# Patient Record
Sex: Male | Born: 1965 | Race: White | Hispanic: No | Smoking: Current every day smoker
Health system: Southern US, Community
[De-identification: ages and names within clinical notes are randomized; demographics above are authoritative.]

## PROBLEM LIST (undated history)

## (undated) DIAGNOSIS — I499 Cardiac arrhythmia, unspecified: Secondary | ICD-10-CM

## (undated) DIAGNOSIS — K8020 Calculus of gallbladder without cholecystitis without obstruction: Secondary | ICD-10-CM

---

## 2003-08-09 ENCOUNTER — Emergency Department (HOSPITAL_COMMUNITY): Admission: EM | Admit: 2003-08-09 | Discharge: 2003-08-09 | Payer: Self-pay | Admitting: Emergency Medicine

## 2005-09-08 ENCOUNTER — Emergency Department: Payer: Self-pay | Admitting: Emergency Medicine

## 2010-02-12 ENCOUNTER — Emergency Department (HOSPITAL_COMMUNITY): Admission: EM | Admit: 2010-02-12 | Discharge: 2010-02-12 | Payer: Self-pay | Admitting: Emergency Medicine

## 2010-02-26 ENCOUNTER — Emergency Department: Payer: Self-pay | Admitting: Emergency Medicine

## 2010-03-04 ENCOUNTER — Emergency Department (HOSPITAL_COMMUNITY): Admission: EM | Admit: 2010-03-04 | Discharge: 2010-03-04 | Payer: Self-pay | Admitting: Emergency Medicine

## 2011-07-18 HISTORY — PX: WISDOM TOOTH EXTRACTION: SHX21

## 2014-06-28 ENCOUNTER — Encounter (HOSPITAL_COMMUNITY): Payer: Self-pay | Admitting: Emergency Medicine

## 2014-06-28 ENCOUNTER — Emergency Department (HOSPITAL_COMMUNITY): Payer: BC Managed Care – PPO

## 2014-06-28 ENCOUNTER — Inpatient Hospital Stay (HOSPITAL_COMMUNITY)
Admission: EM | Admit: 2014-06-28 | Discharge: 2014-06-29 | DRG: 440 | Disposition: A | Discharge status: Home / Self Care | Payer: BC Managed Care – PPO | Attending: Family Medicine | Admitting: Family Medicine

## 2014-06-28 DIAGNOSIS — K859 Acute pancreatitis without necrosis or infection, unspecified: Secondary | ICD-10-CM

## 2014-06-28 DIAGNOSIS — K8020 Calculus of gallbladder without cholecystitis without obstruction: Secondary | ICD-10-CM | POA: Diagnosis present

## 2014-06-28 DIAGNOSIS — R1011 Right upper quadrant pain: Secondary | ICD-10-CM

## 2014-06-28 DIAGNOSIS — F17210 Nicotine dependence, cigarettes, uncomplicated: Secondary | ICD-10-CM | POA: Diagnosis present

## 2014-06-28 HISTORY — DX: Calculus of gallbladder without cholecystitis without obstruction: K80.20

## 2014-06-28 LAB — CBC WITH DIFFERENTIAL/PLATELET
BASOS PCT: 1 % (ref 0–1)
Basophils Absolute: 0.1 10*3/uL (ref 0.0–0.1)
Eosinophils Absolute: 0.2 10*3/uL (ref 0.0–0.7)
Eosinophils Relative: 2 % (ref 0–5)
HCT: 46.6 % (ref 39.0–52.0)
HEMOGLOBIN: 16 g/dL (ref 13.0–17.0)
LYMPHS ABS: 2.9 10*3/uL (ref 0.7–4.0)
Lymphocytes Relative: 26 % (ref 12–46)
MCH: 31.8 pg (ref 26.0–34.0)
MCHC: 34.3 g/dL (ref 30.0–36.0)
MCV: 92.6 fL (ref 78.0–100.0)
MONO ABS: 0.5 10*3/uL (ref 0.1–1.0)
Monocytes Relative: 4 % (ref 3–12)
Neutro Abs: 7.5 10*3/uL (ref 1.7–7.7)
Neutrophils Relative %: 67 % (ref 43–77)
Platelets: 348 10*3/uL (ref 150–400)
RBC: 5.03 MIL/uL (ref 4.22–5.81)
RDW: 13.1 % (ref 11.5–15.5)
WBC: 11 10*3/uL — ABNORMAL HIGH (ref 4.0–10.5)

## 2014-06-28 LAB — COMPREHENSIVE METABOLIC PANEL
ALBUMIN: 4.1 g/dL (ref 3.5–5.2)
ALT: 20 U/L (ref 0–53)
ANION GAP: 13 (ref 5–15)
AST: 16 U/L (ref 0–37)
Alkaline Phosphatase: 92 U/L (ref 39–117)
BILIRUBIN TOTAL: 0.2 mg/dL — AB (ref 0.3–1.2)
BUN: 17 mg/dL (ref 6–23)
CHLORIDE: 99 meq/L (ref 96–112)
CO2: 26 mEq/L (ref 19–32)
CREATININE: 0.93 mg/dL (ref 0.50–1.35)
Calcium: 9.1 mg/dL (ref 8.4–10.5)
GFR calc Af Amer: >90 mL/min (ref >90)
GLUCOSE: 102 mg/dL — AB (ref 70–99)
Potassium: 3.7 mEq/L (ref 3.7–5.3)
Sodium: 138 mEq/L (ref 137–147)
Total Protein: 7.4 g/dL (ref 6.0–8.3)

## 2014-06-28 LAB — LIPASE, BLOOD: Lipase: 465 U/L — ABNORMAL HIGH (ref 11–59)

## 2014-06-28 MED ORDER — SODIUM CHLORIDE 0.9 % IV SOLN
INTRAVENOUS | Status: DC
  Administered 2014-06-28 – 2014-06-29 (×3): via INTRAVENOUS

## 2014-06-28 MED ORDER — HYDROMORPHONE HCL 1 MG/ML IJ SOLN
1.0000 mg | Freq: Once | INTRAMUSCULAR | Status: AC
  Administered 2014-06-28: 1 mg via INTRAVENOUS
  Filled 2014-06-28: qty 1

## 2014-06-28 MED ORDER — MORPHINE SULFATE 4 MG/ML IJ SOLN
6.0000 mg | Freq: Once | INTRAMUSCULAR | Status: AC
  Administered 2014-06-28: 6 mg via INTRAVENOUS
  Filled 2014-06-28: qty 2

## 2014-06-28 MED ORDER — DOCUSATE SODIUM 100 MG PO CAPS: 100.0000 mg | ORAL_CAPSULE | Freq: Every day | ORAL | Status: DC | PRN

## 2014-06-28 MED ORDER — ALUM & MAG HYDROXIDE-SIMETH 200-200-20 MG/5ML PO SUSP: 30.0000 mL | Freq: Four times a day (QID) | ORAL | Status: DC | PRN

## 2014-06-28 MED ORDER — ACETAMINOPHEN 325 MG PO TABS: 650.0000 mg | ORAL_TABLET | Freq: Four times a day (QID) | ORAL | Status: DC | PRN

## 2014-06-28 MED ORDER — ONDANSETRON HCL 4 MG/2ML IJ SOLN
4.0000 mg | Freq: Three times a day (TID) | INTRAMUSCULAR | Status: AC | PRN
  Administered 2014-06-29: 4 mg via INTRAVENOUS
  Filled 2014-06-28: qty 2

## 2014-06-28 MED ORDER — INFLUENZA VAC SPLIT QUAD 0.5 ML IM SUSY
0.5000 mL | PREFILLED_SYRINGE | INTRAMUSCULAR | Status: AC
  Administered 2014-06-29: 0.5 mL via INTRAMUSCULAR
  Filled 2014-06-28: qty 0.5

## 2014-06-28 MED ORDER — ENOXAPARIN SODIUM 40 MG/0.4ML ~~LOC~~ SOLN
40.0000 mg | SUBCUTANEOUS | Status: DC
  Administered 2014-06-28: 40 mg via SUBCUTANEOUS
  Filled 2014-06-28: qty 0.4

## 2014-06-28 MED ORDER — HYDROMORPHONE HCL 1 MG/ML IJ SOLN
1.0000 mg | INTRAMUSCULAR | Status: AC | PRN
  Filled 2014-06-28: qty 1

## 2014-06-28 MED ORDER — IOHEXOL 300 MG/ML  SOLN
100.0000 mL | Freq: Once | INTRAMUSCULAR | Status: AC | PRN
  Administered 2014-06-28: 100 mL via INTRAVENOUS

## 2014-06-28 MED ORDER — IOHEXOL 300 MG/ML  SOLN
50.0000 mL | Freq: Once | INTRAMUSCULAR | Status: AC | PRN
  Administered 2014-06-28: 50 mL via ORAL

## 2014-06-28 MED ORDER — HYDROMORPHONE HCL 1 MG/ML IJ SOLN
1.0000 mg | INTRAMUSCULAR | Status: DC | PRN
  Administered 2014-06-28 – 2014-06-29 (×4): 1 mg via INTRAVENOUS
  Filled 2014-06-28 (×3): qty 1

## 2014-06-28 MED ORDER — NICOTINE 14 MG/24HR TD PT24
14.0000 mg | MEDICATED_PATCH | Freq: Once | TRANSDERMAL | Status: DC
  Administered 2014-06-28: 14 mg via TRANSDERMAL
  Filled 2014-06-28: qty 1

## 2014-06-28 MED ORDER — PNEUMOCOCCAL VAC POLYVALENT 25 MCG/0.5ML IJ INJ
0.5000 mL | INJECTION | INTRAMUSCULAR | Status: AC
  Administered 2014-06-29: 0.5 mL via INTRAMUSCULAR
  Filled 2014-06-28: qty 0.5

## 2014-06-28 MED ORDER — ACETAMINOPHEN 650 MG RE SUPP: 650.0000 mg | Freq: Four times a day (QID) | RECTAL | Status: DC | PRN

## 2014-06-28 NOTE — Progress Notes (Signed)
Patient/Family oriented to room. Information packet given to patient/family. Admission inpatient armband information verified with patient/family to include name and date of birth and placed on patient arm. Side rails up x 2, fall assessment and education completed with patient/family. Call light within reach. Patient/family able to voice and demonstrate understanding of unit orientation instructions  Madiha Bambrick D, RN  

## 2014-06-28 NOTE — H&P (Signed)
History and Physical  Gabriel ClassJohnny L Erxleben WUJ:[id number] DOB: June 11, 1966 DOA: 06/28/2014  Referring physician: Dr. Patria Maneampos, ED physician PCP: No primary care provider on file.   Chief Complaint: Abdominal pain  HPI: Gabriel Bond is a 48 y.o. male  With a history of gallstones. Patient was seen in the emergency department today for right upper quadrant abdominal pain that started this morning. Patient rates the pain as severe, nonradiating. The pain improves briefly, then returns. Patient has had these episodes 3 or 4 times over the past year after discovering that he had gallstones. The pain is usually mild and constant and in the right upper quadrant. Patient does not eat during this time and the pain usually resolves within 24 hours. This time, the pain is more severe and continues to progress. Patient has not eaten anything since that pain began. He has been trying to maintain a healthy diet, although continues to eat some fatty foods. Patient denies alcohol use or any other medication use.  Review of Systems:   Pt complains of mild intermittent headaches.  Pt denies any fevers, chills, nausea, vomiting, earache, constipation, chest pain, shortness of breath, palpitations, cough, wheezing.  Review of systems are otherwise negative  Past Medical History  Diagnosis Date  . Gallstones    History reviewed. No pertinent past surgical history. Social History:  reports that he has been smoking Cigarettes.  He has a 45 pack-year smoking history. He has quit using smokeless tobacco. His smokeless tobacco use included Chew. He reports that he does not drink alcohol or use illicit drugs. Patient lives at home & is able to participate in activities of daily living without assistance  No Known Allergies  Family History  Problem Relation Age of Onset  . Cancer Other   . Diabetes Other       Prior to Admission medications   Medication Sig Start Date End Date Taking? Authorizing Provider    acetaminophen (TYLENOL) 500 MG tablet Take 500-1,500 mg by mouth every 6 (six) hours as needed.   Yes Historical Provider, MD    Physical Exam: BP 107/71 mmHg  Pulse 67  Temp(Src) 98.1 F (36.7 C) (Oral)  Resp 15  Ht 5\' 7"  (1.702 m)  Wt 81.647 kg (180 lb)  BMI 28.19 kg/m2  SpO2 95%  General: Middle-aged Caucasian male. Awake and alert and oriented x3. No acute cardiopulmonary distress.  Eyes: Pupils equal, round, reactive to light. Extraocular muscles are intact. Sclerae anicteric and noninjected.  ENT: Moist mucosal membranes. No mucosal lesions. Teeth in moderate repair  Neck: Neck supple without lymphadenopathy. No carotid bruits. No masses palpated.  Cardiovascular: Regular rate with normal S1-S2 sounds. No murmurs, rubs, gallops auscultated. No JVD.  Respiratory: Good respiratory effort with no wheezes, rales, rhonchi. Lungs clear to auscultation bilaterally.  Abdomen: Soft, nondistended. Right upper quadrant pain without rebound tenderness or guarding. Active bowel sounds. No masses or hepatosplenomegaly  Skin: Dry, warm to touch. 2+ dorsalis pedis and radial pulses. Musculoskeletal: No calf or leg pain. All major joints not erythematous nontender.  Psychiatric: Intact judgment and insight.  Neurologic: No focal neurological deficits. Cranial nerves II through XII are grossly intact.           Labs on Admission:  Basic Metabolic Panel:  Recent Labs Lab 06/28/14 1613  NA 138  K 3.7  CL 99  CO2 26  GLUCOSE 102*  BUN 17  CREATININE 0.93  CALCIUM 9.1   Liver Function Tests:  Recent Labs Lab  06/28/14 1613  AST 16  ALT 20  ALKPHOS 92  BILITOT 0.2*  PROT 7.4  ALBUMIN 4.1    Recent Labs Lab 06/28/14 1613  LIPASE 465*   No results for input(s): AMMONIA in the last 168 hours. CBC:  Recent Labs Lab 06/28/14 1613  WBC 11.0*  NEUTROABS 7.5  HGB 16.0  HCT 46.6  MCV 92.6  PLT 348   Cardiac Enzymes: No results for input(s): CKTOTAL, CKMB, CKMBINDEX,  TROPONINI in the last 168 hours.  BNP (last 3 results) No results for input(s): PROBNP in the last 8760 hours. CBG: No results for input(s): GLUCAP in the last 168 hours.  Radiological Exams on Admission: Ct Abdomen Pelvis W Contrast  06/28/2014   CLINICAL DATA:  Initial evaluation right upper quadrant pain began this morning with nausea, personal history of gallbladder disease  EXAM: CT ABDOMEN AND PELVIS WITH CONTRAST  TECHNIQUE: Multidetector CT imaging of the abdomen and pelvis was performed using the standard protocol following bolus administration of intravenous contrast.  CONTRAST:  50mL OMNIPAQUE IOHEXOL 300 MG/ML SOLN, 100mL OMNIPAQUE IOHEXOL 300 MG/ML SOLN  COMPARISON:  None.  FINDINGS: Mild diffuse hepatic steatosis. Gallbladder appears normal. Spleen is normal. Pancreas is normal. Stomach is normal.  Adrenal glands are normal. Kidneys are normal.Large bowel is normal except for mild diverticulosis. There are several loops of small bowel in the left upper quadrant that show mild wall thickening.  Appendix is normal. Bladder is normal. Reproductive organs are normal. No acute musculoskeletal findings.  IMPRESSION: Findings suggest mild proximal small bowel enteritis.   Electronically Signed   By: Esperanza Heiraymond  Rubner M.D.   On: 06/28/2014 17:55     Assessment/Plan Present on Admission:  . Pancreatitis  #1 pancreatitis Possible gallstone pancreatitis. Will admit the patient and continue nothing by mouth. IV fluids at 200 mL per hour. Continue pain control and repeat lipase in the morning. Will obtain ultrasound to evaluate for gallstones, particularly gallstone in common bile duct.   DVT prophylaxis: Lovenox  Consultants: None  Code Status: Full code  Family Communication: None   Disposition Plan: Home following resolution  Time spent: 50 minutes  Candelaria CelesteJacob Horris Speros, DO [facility] Pager [phone]

## 2014-06-28 NOTE — ED Notes (Signed)
C/o nausea this am and on ride to ER.  Denies vomiting and diarrhea.  Abdomen firm.  No c/o chest pain or SOB.

## 2014-06-28 NOTE — ED Notes (Signed)
Report given to shaquanda RN

## 2014-06-28 NOTE — ED Notes (Signed)
Oral contrast completed.

## 2014-06-28 NOTE — ED Provider Notes (Signed)
CSN: [id number]     Arrival date & time 06/28/14  1518 History   First MD Initiated Contact with Patient 06/28/14 1548     Chief Complaint  Patient presents with  . Abdominal Pain      HPI Patient ports history gallstones reports he developed upper and right upper quadrant abdominal pain which began this morning.  It seems to wax and wane.  He reports nausea without any vomiting.  Denies diarrhea.  Upon my arrival to the room the patient began vomiting.  No hematemesis.  Patient denies alcohol abuse.  Patient is been referred to general surgery better at that time elected not to have his gallbladder removed.  Denies fevers or chills.  Pain is moderate in severity.   Past Medical History  Diagnosis Date  . Gallstones    History reviewed. No pertinent past surgical history. Family History  Problem Relation Age of Onset  . Cancer Other   . Diabetes Other    History  Substance Use Topics  . Smoking status: Current Every Day Smoker -- 1.50 packs/day for 30 years    Types: Cigarettes  . Smokeless tobacco: Former NeurosurgeonUser    Types: Chew  . Alcohol Use: No    Review of Systems  All other systems reviewed and are negative.     Allergies  Review of patient's allergies indicates no known allergies.  Home Medications   Prior to Admission medications   Medication Sig Start Date End Date Taking? Authorizing Provider  acetaminophen (TYLENOL) 500 MG tablet Take 500-1,500 mg by mouth every 6 (six) hours as needed.   Yes Historical Provider, MD   BP 104/75 mmHg  Pulse 56  Temp(Src) 98.1 F (36.7 C) (Oral)  Resp 18  Ht 5\' 7"  (1.702 m)  Wt 180 lb (81.647 kg)  BMI 28.19 kg/m2  SpO2 99% Physical Exam  Constitutional: He is oriented to person, place, and time. He appears well-developed and well-nourished.  HENT:  Head: Normocephalic and atraumatic.  Eyes: EOM are normal.  Neck: Normal range of motion.  Cardiovascular: Normal rate, regular rhythm, normal heart sounds and intact  distal pulses.   Pulmonary/Chest: Effort normal and breath sounds normal. No respiratory distress.  Abdominal: Soft. He exhibits no distension.  Epigastric and right upper quadrant tenderness without guarding or rebound  Musculoskeletal: Normal range of motion.  Neurological: He is alert and oriented to person, place, and time.  Skin: Skin is warm and dry.  Psychiatric: He has a normal mood and affect. Judgment normal.  Nursing note and vitals reviewed.   ED Course  Procedures (including critical care time) Labs Review Labs Reviewed  CBC WITH DIFFERENTIAL - Abnormal; Notable for the following:    WBC 11.0 (*)    All other components within normal limits  COMPREHENSIVE METABOLIC PANEL - Abnormal; Notable for the following:    Glucose, Bld 102 (*)    Total Bilirubin 0.2 (*)    All other components within normal limits  LIPASE, BLOOD - Abnormal; Notable for the following:    Lipase 465 (*)    All other components within normal limits    Imaging Review Ct Abdomen Pelvis W Contrast  06/28/2014   CLINICAL DATA:  Initial evaluation right upper quadrant pain began this morning with nausea, personal history of gallbladder disease  EXAM: CT ABDOMEN AND PELVIS WITH CONTRAST  TECHNIQUE: Multidetector CT imaging of the abdomen and pelvis was performed using the standard protocol following bolus administration of intravenous contrast.  CONTRAST:  50mL OMNIPAQUE IOHEXOL 300 MG/ML SOLN, 100mL OMNIPAQUE IOHEXOL 300 MG/ML SOLN  COMPARISON:  None.  FINDINGS: Mild diffuse hepatic steatosis. Gallbladder appears normal. Spleen is normal. Pancreas is normal. Stomach is normal.  Adrenal glands are normal. Kidneys are normal.Large bowel is normal except for mild diverticulosis. There are several loops of small bowel in the left upper quadrant that show mild wall thickening.  Appendix is normal. Bladder is normal. Reproductive organs are normal. No acute musculoskeletal findings.  IMPRESSION: Findings suggest  mild proximal small bowel enteritis.   Electronically Signed   By: Esperanza Heiraymond  Rubner M.D.   On: 06/28/2014 17:55  I personally reviewed the imaging tests through PACS system I reviewed available ER/hospitalization records through the EMR    EKG Interpretation None      MDM   Final diagnoses:  Right upper quadrant abdominal pain  Acute pancreatitis, unspecified pancreatitis type    Elevated lipase on laboratory studies.  LFTs are normal.  Alkaline phosphatase is normal.  CT scan demonstrates no significant abnormalities.  Specifically there is no obvious biliary dilatation or common bile duct, and on by the radiologist.  I do not have ultrasound capabilities at this time.  Patient will likely need an ultrasound in the morning.  [place] for symptomatically control and monitoring of his LFTs and lipase.  IV fluids now.  Symptomatic treatment.  Gallbladder normal in appearance on CT scan.    Lyanne CoKevin M Eleyna Brugh, MD 06/28/14 [phone]

## 2014-06-28 NOTE — ED Notes (Signed)
Patient c/o right upper abd pain that started this morning. Patient reports nausea but denies any vomiting, diarrhea, fevers, or urinary symptoms. Last BM this morning, denies noting any blood.

## 2014-06-29 ENCOUNTER — Inpatient Hospital Stay (HOSPITAL_COMMUNITY): Payer: BC Managed Care – PPO

## 2014-06-29 LAB — LIPASE, BLOOD: Lipase: 29 U/L (ref 11–59)

## 2014-06-29 NOTE — Care Management Note (Signed)
    Page 1 of 1   06/29/2014     2:40:14 PM CARE MANAGEMENT NOTE 06/29/2014  Patient:  Gabriel Bond,Gabriel Bond   Account Number:  [id number]  Date Initiated:  06/29/2014  Documentation initiated by:  Gabriel HendersonBOLDEN,Latoy Labriola  Subjective/Objective Assessment:   Pt admitted with pancreatitis. He is from home with spouse, and is independent. He is returning home at D/C today.     Action/Plan:   No CM needs identified   Anticipated DC Date:  06/29/2014   Anticipated DC Plan:  HOME/SELF CARE      DC Planning Services  CM consult      Choice offered to / List presented to:             Status of service:  Completed, signed off Medicare Important Message given?   (If response is "NO", the following Medicare IM given date fields will be blank) Date Medicare IM given:   Medicare IM given by:   Date Additional Medicare IM given:   Additional Medicare IM given by:    Discharge Disposition:    Per UR Regulation:  Reviewed for med. necessity/level of care/duration of stay  If discussed at Long Length of Stay Meetings, dates discussed:    Comments:  06/29/14 1400 Gabriel HendersonGeneva Oddis Westling RN/CM

## 2014-06-29 NOTE — Care Management Note (Signed)
UR completed 

## 2014-06-29 NOTE — Discharge Summary (Signed)
Physician Discharge Summary  Gabriel Bond WUJ:[id number] DOB: Jun 16, 1966 DOA: 06/28/2014  PCP: No primary care provider on file.  Admit date: 06/28/2014 Discharge date: 06/29/2014  Time spent: *50 minutes  Recommendations for Outpatient Follow-up:  1. *Follow up PCP in 2 weeks  Discharge Diagnoses:  Active Problems:   Pancreatitis   Discharge Condition: Stable  Diet recommendation: Regular diet  Filed Weights   06/28/14 1542 06/28/14 2022  Weight: 81.647 kg (180 lb) 86.546 kg (190 lb 12.8 oz)    History of present illness:  48 y.o. male  With a history of gallstones. Patient was seen in the emergency department today for right upper quadrant abdominal pain that started this morning. Patient rates the pain as severe, nonradiating. The pain improves briefly, then returns. Patient has had these episodes 3 or 4 times over the past year after discovering that he had gallstones. The pain is usually mild and constant and in the right upper quadrant. Patient does not eat during this time and the pain usually resolves within 24 hours. This time, the pain is more severe and continues to progress. Patient has not eaten anything since that pain began. He has been trying to maintain a healthy diet, although continues to eat some fatty foods.   Hospital Course:  Pancreatitis- resolved, lipase is now down to 29. Patient is tolerating the diet well. US abdomen showed gall stones but no CBD dilation. Patient has seen Dr Lovell SheehanJenkins, in the past and will follow up with him in 2 weeks for possible cholecystectomy.   Procedures  Abdominal ultrasound  Consultations:  None  Discharge Exam: Filed Vitals:   06/29/14 0524  BP: 99/68  Pulse: 56  Temp: 98 F (36.7 C)  Resp: 16    General: Appear in no acute distress Cardiovascular: s1s2 RRR Respiratory: Clear bilaterally  Discharge Instructions You were cared for by a hospitalist during your hospital stay. If you have any questions  about your discharge medications or the care you received while you were in the hospital after you are discharged, you can call the unit and asked to speak with the hospitalist on call if the hospitalist that took care of you is not available. Once you are discharged, your primary care physician will handle any further medical issues. Please note that NO REFILLS for any discharge medications will be authorized once you are discharged, as it is imperative that you return to your primary care physician (or establish a relationship with a primary care physician if you do not have one) for your aftercare needs so that they can reassess your need for medications and monitor your lab values.  Discharge Instructions    Diet - low sodium heart healthy    Complete by:  As directed      Increase activity slowly    Complete by:  As directed           Current Discharge Medication List    CONTINUE these medications which have NOT CHANGED   Details  acetaminophen (TYLENOL) 500 MG tablet Take 500-1,500 mg by mouth every 6 (six) hours as needed.       No Known Allergies    The results of significant diagnostics from this hospitalization (including imaging, microbiology, ancillary and laboratory) are listed below for reference.    Significant Diagnostic Studies: Ct Abdomen Pelvis W Contrast  06/28/2014   CLINICAL DATA:  Initial evaluation right upper quadrant pain began this morning with nausea, personal history of gallbladder disease  EXAM: CT  ABDOMEN AND PELVIS WITH CONTRAST  TECHNIQUE: Multidetector CT imaging of the abdomen and pelvis was performed using the standard protocol following bolus administration of intravenous contrast.  CONTRAST:  50mL OMNIPAQUE IOHEXOL 300 MG/ML SOLN, 100mL OMNIPAQUE IOHEXOL 300 MG/ML SOLN  COMPARISON:  None.  FINDINGS: Mild diffuse hepatic steatosis. Gallbladder appears normal. Spleen is normal. Pancreas is normal. Stomach is normal.  Adrenal glands are normal. Kidneys  are normal.Large bowel is normal except for mild diverticulosis. There are several loops of small bowel in the left upper quadrant that show mild wall thickening.  Appendix is normal. Bladder is normal. Reproductive organs are normal. No acute musculoskeletal findings.  IMPRESSION: Findings suggest mild proximal small bowel enteritis.   Electronically Signed   By: Esperanza Heiraymond  Rubner M.D.   On: 06/28/2014 17:55   [place] Abdomen Limited Ruq  06/29/2014   CLINICAL DATA:  Pancreatitis.  Evaluate for gallstones.  EXAM: US ABDOMEN LIMITED - RIGHT UPPER QUADRANT  COMPARISON:  Abdominal CT 06/28/2014  FINDINGS: Gallbladder:  There are at least 2 echogenic stones within the gallbladder. The stones demonstrate posterior acoustic shadowing. There is no significant gallbladder wall thickening. The patient does not have a sonographic Murphy's sign. Largest gallstone measures up to 1.4 cm. Gallbladder is mildly distended and similar to the recent CT.  Common bile duct:  Diameter: 0.4 cm  Liver:  Liver parenchyma has slightly increased echogenicity. No focal liver lesion. Liver measures 18.9 cm in length without intrahepatic biliary dilatation.  IMPRESSION: Cholelithiasis. No evidence for acute cholecystitis. Negative for biliary dilatation.  Liver parenchyma demonstrates increased echogenicity which could be associated with some steatosis.   Electronically Signed   By: Richarda OverlieAdam  Henn M.D.   On: 06/29/2014 09:58    Microbiology: No results found for this or any previous visit (from the past 240 hour(s)).   Labs: Basic Metabolic Panel:  Recent Labs Lab 06/28/14 1613  NA 138  K 3.7  CL 99  CO2 26  GLUCOSE 102*  BUN 17  CREATININE 0.93  CALCIUM 9.1   Liver Function Tests:  Recent Labs Lab 06/28/14 1613  AST 16  ALT 20  ALKPHOS 92  BILITOT 0.2*  PROT 7.4  ALBUMIN 4.1    Recent Labs Lab 06/28/14 1613 06/29/14 0513  LIPASE 465* 29   No results for input(s): AMMONIA in the last 168 hours. CBC:  Recent  Labs Lab 06/28/14 1613  WBC 11.0*  NEUTROABS 7.5  HGB 16.0  HCT 46.6  MCV 92.6  PLT 348   Cardiac Enzymes: No results for input(s): CKTOTAL, CKMB, CKMBINDEX, TROPONINI in the last 168 hours. BNP: BNP (last 3 results) No results for input(s): PROBNP in the last 8760 hours. CBG: No results for input(s): GLUCAP in the last 168 hours.  SignedMauro Kaufmann:  Zimri Brennen S  [facility] 06/29/2014, 12:41 PM

## 2014-06-29 NOTE — Progress Notes (Signed)
Discharge instruction reviewed with patient. IV removed. No distress noted. Ambulated to lobby

## 2015-01-14 ENCOUNTER — Encounter (HOSPITAL_COMMUNITY): Payer: Self-pay

## 2015-01-14 ENCOUNTER — Encounter (HOSPITAL_COMMUNITY)
Admission: RE | Admit: 2015-01-14 | Discharge: 2015-01-14 | Disposition: A | Discharge status: Home / Self Care | Payer: BLUE CROSS/BLUE SHIELD | Source: Ambulatory Visit | Attending: General Surgery | Admitting: General Surgery

## 2015-01-14 DIAGNOSIS — K8020 Calculus of gallbladder without cholecystitis without obstruction: Secondary | ICD-10-CM | POA: Diagnosis not present

## 2015-01-14 DIAGNOSIS — Z01818 Encounter for other preprocedural examination: Secondary | ICD-10-CM | POA: Diagnosis not present

## 2015-01-14 HISTORY — DX: Cardiac arrhythmia, unspecified: I49.9

## 2015-01-14 LAB — CBC WITH DIFFERENTIAL/PLATELET
Basophils Absolute: 0.1 10*3/uL (ref 0.0–0.1)
Basophils Relative: 1 % (ref 0–1)
EOS PCT: 4 % (ref 0–5)
Eosinophils Absolute: 0.3 10*3/uL (ref 0.0–0.7)
HCT: 48.7 % (ref 39.0–52.0)
Hemoglobin: 16.6 g/dL (ref 13.0–17.0)
Lymphocytes Relative: 33 % (ref 12–46)
Lymphs Abs: 2.7 10*3/uL (ref 0.7–4.0)
MCH: 31.6 pg (ref 26.0–34.0)
MCHC: 34.1 g/dL (ref 30.0–36.0)
MCV: 92.8 fL (ref 78.0–100.0)
MONOS PCT: 7 % (ref 3–12)
Monocytes Absolute: 0.6 10*3/uL (ref 0.1–1.0)
NEUTROS ABS: 4.7 10*3/uL (ref 1.7–7.7)
NEUTROS PCT: 55 % (ref 43–77)
Platelets: 314 10*3/uL (ref 150–400)
RBC: 5.25 MIL/uL (ref 4.22–5.81)
RDW: 13.2 % (ref 11.5–15.5)
WBC: 8.3 10*3/uL (ref 4.0–10.5)

## 2015-01-14 LAB — BASIC METABOLIC PANEL
ANION GAP: 8 (ref 5–15)
BUN: 18 mg/dL (ref 6–20)
CO2: 24 mmol/L (ref 22–32)
Calcium: 9 mg/dL (ref 8.9–10.3)
Chloride: 106 mmol/L (ref 101–111)
Creatinine, Ser: 0.77 mg/dL (ref 0.61–1.24)
GFR calc non Af Amer: >60 mL/min (ref >60)
GLUCOSE: 88 mg/dL (ref 65–99)
POTASSIUM: 3.8 mmol/L (ref 3.5–5.1)
Sodium: 138 mmol/L (ref 135–145)

## 2015-01-14 LAB — HEPATIC FUNCTION PANEL
ALT: 17 U/L (ref 17–63)
AST: 16 U/L (ref 15–41)
Albumin: 4 g/dL (ref 3.5–5.0)
Alkaline Phosphatase: 65 U/L (ref 38–126)
Bilirubin, Direct: 0.1 mg/dL (ref 0.1–0.5)
Indirect Bilirubin: 0.4 mg/dL (ref 0.3–0.9)
Total Bilirubin: 0.5 mg/dL (ref 0.3–1.2)
Total Protein: 6.8 g/dL (ref 6.5–8.1)

## 2015-01-14 NOTE — H&P (Signed)
  NTS SOAP Note  Vital Signs:  Vitals as of: 01/14/2015: Systolic 132: Diastolic 75: Heart Rate 73: Temp 95F: Height 35ft 7in: Weight 189Lbs 0 Ounces: BMI 29.6  BMI : 29.6 kg/m2  Subjective: This 49 year old male presents for of gallstone pancreatitis.  Had one episode of pancreatitis for which he was hospitalized.  U/S shows cholelithiasis,  normal common bile duct.  Currently is asymptomatic.  Review of Symptoms:  Constitutional:fatigue Head:unremarkable Eyes:unremarkable   Nose/Mouth/Throat:unremarkable Cardiovascular:  unremarkable Respiratory:wheezing, cough Gastrointestinheartburn Genitourinary:frequency back and joint pain dry Hematolgic/Lymphatic:unremarkable   Allergic/Immunologic:unremarkable   Past Medical History:  Reviewed  Past Medical History  Surgical History: unknown Medical Problems: none Allergies: nkda Medications: none   Social History:Reviewed  Social History  Preferred Language: English Race:  White Ethnicity: Not Hispanic / Latino Age: [age] year Marital Status:  S Alcohol: no   Smoking Status: Current every day smoker reviewed on 09/24/2014 Started Date:  Packs per day: 1.50 Functional Status reviewed on 09/24/2014 ------------------------------------------------ Bathing: Normal Cooking: Normal Dressing: Normal Driving: Normal Eating: Normal Managing Meds: Normal Oral Care: Normal Shopping: Normal Toileting: Normal Transferring: Normal Walking: Normal Cognitive Status reviewed on 09/24/2014 ------------------------------------------------ Attention: Normal Decision Making: Normal Language: Normal Memory: Normal Motor: Normal Perception: Normal Problem Solving: Normal Visual and Spatial: Normal   Family History:Reviewed  Family Health History Mother, Living; Healthy;  Father, Living; Healthy;     Objective Information: General:Well appearing, well nourished in no distress. Heart:RRR, no murmur  or gallop.  Normal S1, S2.  No S3, S4.  Lungs:  CTA bilaterally, no wheezes, rhonchi, rales.  Breathing unlabored. Abdomen:Soft, NT/ND, no HSM, no masses.  Assessment:gallstone pancreatitis,  cholelithiasis   Diagnoses: 574.20  K80.20 Gallstone (Calculus of gallbladder without cholecystitis without obstruction)  Procedures: [id number] - OFFICE OUTPATIENT NEW 30 MINUTES    Plan:  Patient will call to schedule laparoscopic cholecystectomy.   Patient Education:Alternative treatments to surgery were discussed with patient (and family).  Risks and benefits  of procedure including bleeding,  infection,  hepatobiliary iinjury,  and the possibility of an open procedure were fully explained to the patient (and family) who gave informed consent. Patient/family questions were addressed.  Follow-up:Pending Surgery

## 2015-01-14 NOTE — Patient Instructions (Signed)
Gabriel Bond  01/14/2015     @PREFPERIOPPHARMACY @   Your procedure is scheduled on 01/20/2015  [name]  [facility]  A.M.  Call this number if you have problems the morning of surgery:  [phone]   Remember:  Do not eat food or drink liquids after midnight.  Take these medicines the morning of surgery with A SIP OF WATER  none   Do not wear jewelry, make-up or nail polish.  Do not wear lotions, powders, or perfumes.    Do not shave 48 hours prior to surgery.  Men may shave face and neck.  Do not bring valuables to the hospital.  [name] is not responsible for any belongings or valuables.  Contacts, dentures or bridgework may not be worn into surgery.  Leave your suitcase in the car.  After surgery it may be brought to your room.  For patients admitted to the hospital, discharge time will be determined by your treatment team.  Patients discharged the day of surgery will not be allowed to drive home.   Name and phone number of your driver:   family Special instructions:  none  Please read over the following fact sheets that you were given. Pain Booklet, Coughing and Deep Breathing, Surgical Site Infection Prevention, Anesthesia Post-op Instructions and Care and Recovery After Surgery      Laparoscopic Cholecystectomy Laparoscopic cholecystectomy is surgery to remove the gallbladder. The gallbladder is located in the upper right part of the abdomen, behind the liver. It is a storage sac for bile produced in the liver. Bile aids in the digestion and absorption of fats. Cholecystectomy is often done for inflammation of the gallbladder (cholecystitis). This condition is usually caused by a buildup of gallstones (cholelithiasis) in your gallbladder. Gallstones can block the flow of bile, resulting in inflammation and pain. In severe cases, emergency surgery may be required. When emergency surgery is not required, you will have time to prepare for the  procedure. Laparoscopic surgery is an alternative to open surgery. Laparoscopic surgery has a shorter recovery time. Your common bile duct may also need to be examined during the procedure. If stones are found in the common bile duct, they may be removed. LET [facility] PROVID[facility] KNOW ABOUT:  Any allergies you have.  All medicines you are taking, including vitamins, herbs, eye drops, creams, and over-the-counter medicines.  Previous problems you or members of your family have had with the use of anesthetics.  Any blood disorders you have.  Previous surgeries you have had.  Medical conditions you have. RISKS AND COMPLICATIONS Generally, this is a safe procedure. However, as with any procedure, complications can occur. Possible complications include:  Infection.  Damage to the common bile duct, nerves, arteries, veins, or other internal organs such as the stomach, liver, or intestines.  Bleeding.  A stone may remain in the common bile duct.  A bile leak from the cyst duct that is clipped when your gallbladder is removed.  The need to convert to open surgery, which requires a larger incision in the abdomen. This may be necessary if your surgeon thinks it is not safe to continue with a laparoscopic procedure. BEFORE THE PROCEDURE  Ask your health care provider about changing or stopping any regular medicines. You will need to stop taking aspirin or blood thinners at least 5 days prior to surgery.  Do not eat or drink anything after midnight the night before surgery.  Let  your health care provider know if you develop a cold or other infectious problem before surgery. PROCEDURE   You will be given medicine to make you sleep through the procedure (general anesthetic). A breathing tube will be placed in your mouth.  When you are asleep, your surgeon will make several small cuts (incisions) in your abdomen.  A thin, lighted tube with a tiny camera on the end (laparoscope) is  inserted through one of the small incisions. The camera on the laparoscope sends a picture to a TV screen in the operating room. This gives the surgeon a good view inside your abdomen.  A gas will be pumped into your abdomen. This expands your abdomen so that the surgeon has more room to perform the surgery.  Other tools needed for the procedure are inserted through the other incisions. The gallbladder is removed through one of the incisions.  After the removal of your gallbladder, the incisions will be closed with stitches, staples, or skin glue. AFTER THE PROCEDURE  You will be taken to a recovery area where your progress will be checked often.  You may be allowed to go home the same day if your pain is controlled and you can tolerate liquids. Document Released: 07/03/2005 Document Revised: 04/23/2013 Document Reviewed: 02/12/2013 [name] Patient Information 2015 [name], [place]. This information is not intended to replace advice given to you by your health care provider. Make sure you discuss any questions you have with your health care provider. PATIENT INSTRUCTIONS POST-ANESTHESIA  IMMEDIATELY FOLLOWING SURGERY:  Do not drive or operate machinery for the first twenty four hours after surgery.  Do not make any important decisions for twenty four hours after surgery or while taking narcotic pain medications or sedatives.  If you develop intractable nausea and vomiting or a severe headache please notify your doctor immediately.  FOLLOW-UP:  Please make an appointment with your surgeon as instructed. You do not need to follow up with anesthesia unless specifically instructed to do so.  WOUND CARE INSTRUCTIONS (if applicable):  Keep a dry clean dressing on the anesthesia/puncture wound site if there is drainage.  Once the wound has quit draining you may leave it open to air.  Generally you should leave the bandage intact for twenty four hours unless there is drainage.  If the epidural site  drains for more than 36-48 hours please call the anesthesia department.  QUESTIONS?:  Please feel free to call your physician or the hospital operator if you have any questions, and they will be happy to assist you.

## 2015-01-20 ENCOUNTER — Ambulatory Visit (HOSPITAL_COMMUNITY)
Admission: RE | Admit: 2015-01-20 | Discharge: 2015-01-20 | Disposition: A | Discharge status: Home / Self Care | Payer: BLUE CROSS/BLUE SHIELD | Source: Ambulatory Visit | Attending: General Surgery | Admitting: General Surgery

## 2015-01-20 ENCOUNTER — Encounter (HOSPITAL_COMMUNITY): Payer: Self-pay | Admitting: *Deleted

## 2015-01-20 ENCOUNTER — Ambulatory Visit (HOSPITAL_COMMUNITY): Payer: BLUE CROSS/BLUE SHIELD | Admitting: Anesthesiology

## 2015-01-20 ENCOUNTER — Encounter (HOSPITAL_COMMUNITY)
Admission: RE | Disposition: A | Discharge status: Home / Self Care | Payer: Self-pay | Source: Ambulatory Visit | Attending: General Surgery

## 2015-01-20 DIAGNOSIS — K851 Biliary acute pancreatitis: Secondary | ICD-10-CM | POA: Insufficient documentation

## 2015-01-20 DIAGNOSIS — F17210 Nicotine dependence, cigarettes, uncomplicated: Secondary | ICD-10-CM | POA: Diagnosis not present

## 2015-01-20 DIAGNOSIS — K8010 Calculus of gallbladder with chronic cholecystitis without obstruction: Secondary | ICD-10-CM | POA: Insufficient documentation

## 2015-01-20 DIAGNOSIS — K8020 Calculus of gallbladder without cholecystitis without obstruction: Secondary | ICD-10-CM | POA: Diagnosis present

## 2015-01-20 HISTORY — PX: CHOLECYSTECTOMY: SHX55

## 2015-01-20 SURGERY — LAPAROSCOPIC CHOLECYSTECTOMY
Anesthesia: General | Site: Abdomen

## 2015-01-20 MED ORDER — CHLORHEXIDINE GLUCONATE 4 % EX LIQD: 1.0000 "application " | Freq: Once | CUTANEOUS | Status: DC

## 2015-01-20 MED ORDER — BUPIVACAINE HCL (PF) 0.5 % IJ SOLN
INTRAMUSCULAR | Status: AC
  Filled 2015-01-20: qty 30

## 2015-01-20 MED ORDER — DEXTROSE 5 % IV SOLN
INTRAVENOUS | Status: DC | PRN
  Administered 2015-01-20: 09:00:00 via INTRAVENOUS

## 2015-01-20 MED ORDER — POVIDONE-IODINE 10 % OINT PACKET
TOPICAL_OINTMENT | CUTANEOUS | Status: DC | PRN
  Administered 2015-01-20: 1 via TOPICAL

## 2015-01-20 MED ORDER — FENTANYL CITRATE (PF) 250 MCG/5ML IJ SOLN
INTRAMUSCULAR | Status: AC
  Filled 2015-01-20: qty 5

## 2015-01-20 MED ORDER — PROPOFOL 10 MG/ML IV BOLUS
INTRAVENOUS | Status: DC | PRN
  Administered 2015-01-20: 180 mg via INTRAVENOUS

## 2015-01-20 MED ORDER — PROPOFOL 10 MG/ML IV BOLUS
INTRAVENOUS | Status: AC
  Filled 2015-01-20: qty 20

## 2015-01-20 MED ORDER — MIDAZOLAM HCL 5 MG/5ML IJ SOLN
INTRAMUSCULAR | Status: DC | PRN
  Administered 2015-01-20: 2 mg via INTRAVENOUS

## 2015-01-20 MED ORDER — KETOROLAC TROMETHAMINE 30 MG/ML IJ SOLN
30.0000 mg | Freq: Once | INTRAMUSCULAR | Status: AC
  Administered 2015-01-20: 30 mg via INTRAVENOUS

## 2015-01-20 MED ORDER — FENTANYL CITRATE (PF) 100 MCG/2ML IJ SOLN
INTRAMUSCULAR | Status: AC
  Filled 2015-01-20: qty 2

## 2015-01-20 MED ORDER — GLYCOPYRROLATE 0.2 MG/ML IJ SOLN
INTRAMUSCULAR | Status: DC | PRN
  Administered 2015-01-20: 0.6 mg via INTRAVENOUS

## 2015-01-20 MED ORDER — ONDANSETRON HCL 4 MG/2ML IJ SOLN
4.0000 mg | Freq: Once | INTRAMUSCULAR | Status: AC
  Administered 2015-01-20: 4 mg via INTRAVENOUS

## 2015-01-20 MED ORDER — MIDAZOLAM HCL 2 MG/2ML IJ SOLN
INTRAMUSCULAR | Status: AC
  Filled 2015-01-20: qty 2

## 2015-01-20 MED ORDER — MIDAZOLAM HCL 2 MG/2ML IJ SOLN
1.0000 mg | INTRAMUSCULAR | Status: DC | PRN
  Administered 2015-01-20: 2 mg via INTRAVENOUS

## 2015-01-20 MED ORDER — SUCCINYLCHOLINE CHLORIDE 20 MG/ML IJ SOLN
INTRAMUSCULAR | Status: DC | PRN
  Administered 2015-01-20: 180 mg via INTRAVENOUS

## 2015-01-20 MED ORDER — CIPROFLOXACIN IN D5W 400 MG/200ML IV SOLN
INTRAVENOUS | Status: AC
  Filled 2015-01-20: qty 200

## 2015-01-20 MED ORDER — HEMOSTATIC AGENTS (NO CHARGE) OPTIME
TOPICAL | Status: DC | PRN
  Administered 2015-01-20: 1 via TOPICAL

## 2015-01-20 MED ORDER — NEOSTIGMINE METHYLSULFATE 10 MG/10ML IV SOLN
INTRAVENOUS | Status: AC
  Filled 2015-01-20: qty 1

## 2015-01-20 MED ORDER — BUPIVACAINE HCL (PF) 0.5 % IJ SOLN
INTRAMUSCULAR | Status: DC | PRN
  Administered 2015-01-20: 10 mL

## 2015-01-20 MED ORDER — LIDOCAINE HCL 1 % IJ SOLN
INTRAMUSCULAR | Status: DC | PRN
  Administered 2015-01-20: 30 mg via INTRADERMAL

## 2015-01-20 MED ORDER — NEOSTIGMINE METHYLSULFATE 10 MG/10ML IV SOLN
INTRAVENOUS | Status: DC | PRN
  Administered 2015-01-20: 3 mg via INTRAVENOUS
  Administered 2015-01-20: 1 mg via INTRAVENOUS

## 2015-01-20 MED ORDER — ROCURONIUM BROMIDE 100 MG/10ML IV SOLN
INTRAVENOUS | Status: DC | PRN
  Administered 2015-01-20: 30 mg via INTRAVENOUS

## 2015-01-20 MED ORDER — OXYCODONE-ACETAMINOPHEN 7.5-325 MG PO TABS: 1.0000 | ORAL_TABLET | ORAL | Status: AC | PRN

## 2015-01-20 MED ORDER — FENTANYL CITRATE (PF) 100 MCG/2ML IJ SOLN
25.0000 ug | INTRAMUSCULAR | Status: DC | PRN
  Administered 2015-01-20 (×2): 50 ug via INTRAVENOUS

## 2015-01-20 MED ORDER — KETOROLAC TROMETHAMINE 30 MG/ML IJ SOLN
INTRAMUSCULAR | Status: AC
  Filled 2015-01-20: qty 1

## 2015-01-20 MED ORDER — ONDANSETRON HCL 4 MG/2ML IJ SOLN: 4.0000 mg | Freq: Once | INTRAMUSCULAR | Status: DC | PRN

## 2015-01-20 MED ORDER — SODIUM CHLORIDE 0.9 % IR SOLN
Status: DC | PRN
  Administered 2015-01-20: 1000 mL

## 2015-01-20 MED ORDER — GLYCOPYRROLATE 0.2 MG/ML IJ SOLN
INTRAMUSCULAR | Status: AC
  Filled 2015-01-20: qty 3

## 2015-01-20 MED ORDER — CIPROFLOXACIN IN D5W 400 MG/200ML IV SOLN
400.0000 mg | INTRAVENOUS | Status: AC
  Administered 2015-01-20: 400 mg via INTRAVENOUS

## 2015-01-20 MED ORDER — ONDANSETRON HCL 4 MG/2ML IJ SOLN
INTRAMUSCULAR | Status: AC
Start: 2015-01-20 — End: 2015-01-20
  Filled 2015-01-20: qty 2

## 2015-01-20 MED ORDER — LACTATED RINGERS IV SOLN
INTRAVENOUS | Status: DC
  Administered 2015-01-20: 09:00:00 via INTRAVENOUS

## 2015-01-20 MED ORDER — POVIDONE-IODINE 10 % EX OINT
TOPICAL_OINTMENT | CUTANEOUS | Status: AC
  Filled 2015-01-20: qty 1

## 2015-01-20 MED ORDER — FENTANYL CITRATE (PF) 100 MCG/2ML IJ SOLN
INTRAMUSCULAR | Status: DC | PRN
  Administered 2015-01-20: 100 ug via INTRAVENOUS
  Administered 2015-01-20: 50 ug via INTRAVENOUS
  Administered 2015-01-20: 100 ug via INTRAVENOUS

## 2015-01-20 SURGICAL SUPPLY — 44 items
APPLIER CLIP LAPSCP 10X32 DD (CLIP) ×3 IMPLANT
BAG HAMPER (MISCELLANEOUS) ×3 IMPLANT
CHLORAPREP W/TINT 26ML (MISCELLANEOUS) ×3 IMPLANT
CLOTH BEACON ORANGE TIMEOUT ST (SAFETY) ×3 IMPLANT
COVER LIGHT HANDLE STERIS (MISCELLANEOUS) ×6 IMPLANT
DECANTER SPIKE VIAL GLASS SM (MISCELLANEOUS) ×3 IMPLANT
ELECT REM PT RETURN 9FT ADLT (ELECTROSURGICAL) ×3
ELECTRODE REM PT RTRN 9FT ADLT (ELECTROSURGICAL) ×1 IMPLANT
FILTER SMOKE EVAC LAPAROSHD (FILTER) ×3 IMPLANT
FORMALIN 10 PREFIL 120ML (MISCELLANEOUS) ×3 IMPLANT
GLOVE BIOGEL PI IND STRL 7.0 (GLOVE) ×1 IMPLANT
GLOVE BIOGEL PI IND STRL 7.5 (GLOVE) ×1 IMPLANT
GLOVE BIOGEL PI INDICATOR 7.0 (GLOVE) ×2
GLOVE BIOGEL PI INDICATOR 7.5 (GLOVE) ×2
GLOVE ECLIPSE 6.5 STRL STRAW (GLOVE) ×3 IMPLANT
GLOVE EXAM NITRILE MD LF STRL (GLOVE) ×3 IMPLANT
GLOVE SURG SS PI 7.5 STRL IVOR (GLOVE) ×6 IMPLANT
GOWN STRL REUS W/ TWL XL LVL3 (GOWN DISPOSABLE) ×1 IMPLANT
GOWN STRL REUS W/TWL LRG LVL3 (GOWN DISPOSABLE) ×6 IMPLANT
GOWN STRL REUS W/TWL XL LVL3 (GOWN DISPOSABLE) ×2
HEMOSTAT SNOW SURGICEL 2X4 (HEMOSTASIS) ×3 IMPLANT
INST SET LAPROSCOPIC AP (KITS) ×3 IMPLANT
IV NS IRRIG 3000ML ARTHROMATIC (IV SOLUTION) IMPLANT
KIT ROOM TURNOVER APOR (KITS) ×3 IMPLANT
MANIFOLD NEPTUNE II (INSTRUMENTS) ×3 IMPLANT
NEEDLE INSUFFLATION 14GA 120MM (NEEDLE) ×3 IMPLANT
NS IRRIG 1000ML POUR BTL (IV SOLUTION) ×3 IMPLANT
PACK LAP CHOLE LZT030E (CUSTOM PROCEDURE TRAY) ×3 IMPLANT
PAD ARMBOARD 7.5X6 YLW CONV (MISCELLANEOUS) ×3 IMPLANT
POUCH SPECIMEN RETRIEVAL 10MM (ENDOMECHANICALS) ×3 IMPLANT
SET BASIN LINEN APH (SET/KITS/TRAYS/PACK) ×3 IMPLANT
SET TUBE IRRIG SUCTION NO TIP (IRRIGATION / IRRIGATOR) IMPLANT
SLEEVE ENDOPATH XCEL 5M (ENDOMECHANICALS) ×3 IMPLANT
SPONGE GAUZE 2X2 8PLY STER LF (GAUZE/BANDAGES/DRESSINGS) ×4
SPONGE GAUZE 2X2 8PLY STRL LF (GAUZE/BANDAGES/DRESSINGS) ×8 IMPLANT
STAPLER VISISTAT (STAPLE) ×3 IMPLANT
SUT VICRYL 0 UR6 27IN ABS (SUTURE) ×6 IMPLANT
TAPE CLOTH SURG 4X10 WHT LF (GAUZE/BANDAGES/DRESSINGS) ×3 IMPLANT
TROCAR ENDO BLADELESS 11MM (ENDOMECHANICALS) ×3 IMPLANT
TROCAR XCEL NON-BLD 5MMX100MML (ENDOMECHANICALS) ×3 IMPLANT
TROCAR XCEL UNIV SLVE 11M 100M (ENDOMECHANICALS) ×3 IMPLANT
TUBING INSUFFLATION (TUBING) ×3 IMPLANT
WARMER LAPAROSCOPE (MISCELLANEOUS) ×3 IMPLANT
YANKAUER SUCT 12FT TUBE ARGYLE (SUCTIONS) ×3 IMPLANT

## 2015-01-20 NOTE — Op Note (Signed)
Patient:  Gabriel ClassJohnny L Mackintosh  DOB:  12-Dec-1965  MRN:  [id number]   Preop Diagnosis:  Cholelithiasis, history of gallstone pancreatitis  Postop Diagnosis:  Same  Procedure:  Laparoscopic cholecystectomy  Surgeon:  Franky MachoMark Jaley Yan, M.D.  Anes:  Gen. endotracheal  Indications:  Patient is a 49 year old white male who has a history of cholelithiasis and gallstone pancreatitis. The risks and benefits of the procedure including bleeding, infection, hepatobiliary injury, and the possibility of an open procedure were fully explained to the patient, who gave informed consent.  Procedure note:  The patient was placed the supine position. After induction of general endotracheal anesthesia, the abdomen was prepped and draped using the usual sterile technique with DuraPrep. Surgical site confirmation was performed.  A supraumbilical incision was made down to the fascia. A Veress needle was introduced into the abdominal cavity and confirmation of placement was done using the saline drop test. The abdomen was then insufflated to 16 mmHg pressure. An 11 mm trocar was introduced into the abdominal cavity under direct visualization without difficulty. The patient was placed in reverse Trendelenburg position and an additional 11 mm trocar was placed the epigastric region and 5 mm trochars were placed the right upper quadrant and right flank regions. Liver was inspected and noted to be within normal limits. The gallbladder was retracted in a dynamic fashion in order to expose the triangle of Calot. The cystic duct was first identified. Its junction to the infundibulum was fully identified. Endoclips were placed proximally and distally on the cystic duct, and the cystic duct was divided. This was likewise done to the cystic artery. The gallbladder was freed away from the gallbladder fossa using Bovie electrocautery. The gallbladder was delivered through the epigastric trocar site using an Endo Catch bag. The gallbladder  fossa was inspected no abnormal bleeding or bile leakage was noted. Surgicel is placed the gallbladder fossa. All fluid and air were then evacuated from the abdominal cavity prior to removal of the trochars.  All wounds were irrigated with normal saline. All wounds were injected with 0.5% Sensorcaine. The supraumbilical fascia as well as epigastric fascia were reapproximated using 0 Vicryl interrupted sutures. All skin incisions were closed using staples. Betadine ointment and dry sterile dressings were applied.  All tape and needle counts were correct at the end of the procedure. Patient was extubated in the operating room and transferred to PACU in stable condition.  Complications:  None  EBL:  Minimal  Specimen:  Gallbladder

## 2015-01-20 NOTE — Interval H&P Note (Signed)
History and Physical Interval Note:  01/20/2015 8:46 AM  Gabriel Bond  has presented today for surgery, with the diagnosis of cholelithiasis  The various methods of treatment have been discussed with the patient and family. After consideration of risks, benefits and other options for treatment, the patient has consented to  Procedure(s): LAPAROSCOPIC CHOLECYSTECTOMY (N/A) as a surgical intervention .  The patient's history has been reviewed, patient examined, no change in status, stable for surgery.  I have reviewed the patient's chart and labs.  Questions were answered to the patient's satisfaction.     Franky MachoJENKINS,Earlie Schank A

## 2015-01-20 NOTE — Discharge Instructions (Signed)

## 2015-01-20 NOTE — Anesthesia Postprocedure Evaluation (Signed)
  Anesthesia Post-op Note  Patient: Gabriel Bond  Procedure(s) Performed: Procedure(s): LAPAROSCOPIC CHOLECYSTECTOMY (N/A)  Patient Location: PACU  Anesthesia Type:General  Level of Consciousness: awake, alert , oriented and patient cooperative  Airway and Oxygen Therapy: Patient Spontanous Breathing  Post-op Pain: 2 /10, mild  Post-op Assessment: Post-op Vital signs reviewed, Patient's Cardiovascular Status Stable, Respiratory Function Stable, Patent Airway, No signs of Nausea or vomiting, Pain level controlled and No headache              Post-op Vital Signs: Reviewed and stable  Last Vitals:  Filed Vitals:   01/20/15 1002  BP: 119/71  Pulse: 55  Temp: 36.8 C  Resp: 13    Complications: No apparent anesthesia complications

## 2015-01-20 NOTE — OR Nursing (Signed)
Son in to see patient  Dad gave son money from wallet then locked up wallet with clothes in locker 10

## 2015-01-20 NOTE — Transfer of Care (Signed)
Immediate Anesthesia Transfer of Care Note  Patient: Gabriel Bond  Procedure(s) Performed: Procedure(s): LAPAROSCOPIC CHOLECYSTECTOMY (N/A)  Patient Location: PACU  Anesthesia Type:General  Level of Consciousness: awake and patient cooperative  Airway & Oxygen Therapy: Patient Spontanous Breathing and Patient connected to face mask oxygen  Post-op Assessment: Report given to RN, Post -op Vital signs reviewed and stable and Patient moving all extremities  Post vital signs: Reviewed and stable  Last Vitals:  Filed Vitals:   01/20/15 0900  BP: 103/63  Pulse:   Temp:   Resp: 14    Complications: No apparent anesthesia complications

## 2015-01-20 NOTE — Anesthesia Preprocedure Evaluation (Signed)
Anesthesia Evaluation  Patient identified by MRN, date of birth, ID band Patient awake    Reviewed: Allergy & Precautions, NPO status , Patient's Chart, lab work & pertinent test results  Airway Mallampati: I  TM Distance: >3 FB     Dental  (+) Poor Dentition, Loose, Chipped, Dental Advisory Given   Pulmonary Current Smoker,  breath sounds clear to auscultation        Cardiovascular negative cardio ROS  + dysrhythmias (hx irreg HR) Rhythm:Regular Rate:Normal     Neuro/Psych    GI/Hepatic negative GI ROS,   Endo/Other    Renal/GU      Musculoskeletal   Abdominal   Peds  Hematology   Anesthesia Other Findings   Reproductive/Obstetrics                             Anesthesia Physical Anesthesia Plan  ASA: II  Anesthesia Plan: General   Post-op Pain Management:    Induction: Intravenous, Cricoid pressure planned and Rapid sequence  Airway Management Planned: Oral ETT  Additional Equipment:   Intra-op Plan:   Post-operative Plan: Extubation in OR  Informed Consent: I have reviewed the patients History and Physical, chart, labs and discussed the procedure including the risks, benefits and alternatives for the proposed anesthesia with the patient or authorized representative who has indicated his/her understanding and acceptance.     Plan Discussed with:   Anesthesia Plan Comments:         Anesthesia Quick Evaluation

## 2015-01-20 NOTE — Anesthesia Procedure Notes (Signed)
Procedure Name: Intubation Date/Time: 01/20/2015 9:15 AM Performed by: Despina HiddenIDACAVAGE, Allix Blomquist J Pre-anesthesia Checklist: Patient being monitored, Suction available, Emergency Drugs available and Patient identified Patient Re-evaluated:Patient Re-evaluated prior to inductionOxygen Delivery Method: Circle system utilized Preoxygenation: Pre-oxygenation with 100% oxygen Intubation Type: IV induction, Rapid sequence and Cricoid Pressure applied Ventilation: Mask ventilation without difficulty and Oral airway inserted - appropriate to patient size Laryngoscope Size: Mac and 3 Grade View: Grade I Tube type: Oral Tube size: 8.0 mm Number of attempts: 1 Airway Equipment and Method: Stylet Placement Confirmation: ETT inserted through vocal cords under direct vision,  positive ETCO2 and breath sounds checked- equal and bilateral Secured at: 22 cm Tube secured with: Tape Dental Injury: Teeth and Oropharynx as per pre-operative assessment

## 2015-01-21 ENCOUNTER — Encounter (HOSPITAL_COMMUNITY): Payer: Self-pay | Admitting: General Surgery

## 2015-12-12 IMAGING — CT CT ABD-PELV W/ CM
2 of 5 series · 17 of 46 positions shown, 19 images · IV contrast (Omnipaque 300)
Comparison: None.

CLINICAL DATA: Initial evaluation right upper quadrant pain began
this morning with nausea, personal history of gallbladder disease

EXAM:
CT ABDOMEN AND PELVIS WITH CONTRAST
TECHNIQUE: Multidetector CT imaging of the abdomen and pelvis was performed
using the standard protocol following bolus administration of
intravenous contrast.
CONTRAST:  50mL OMNIPAQUE IOHEXOL 300 MG/ML SOLN, 100mL OMNIPAQUE
IOHEXOL 300 MG/ML SOLN

[Series 2: abd_pel_with 5.0 b40f · axial · 0.80mm/px · z∈[-508,-72]mm · 14 of 99 slices shown, 16 images]
[im 6/99  soft-tissue]
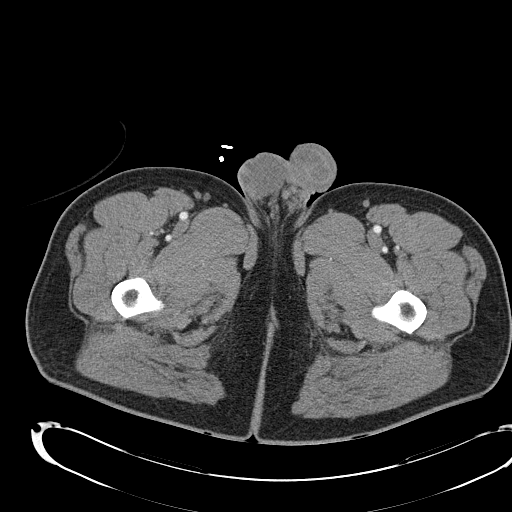
[im 6/99  bone]
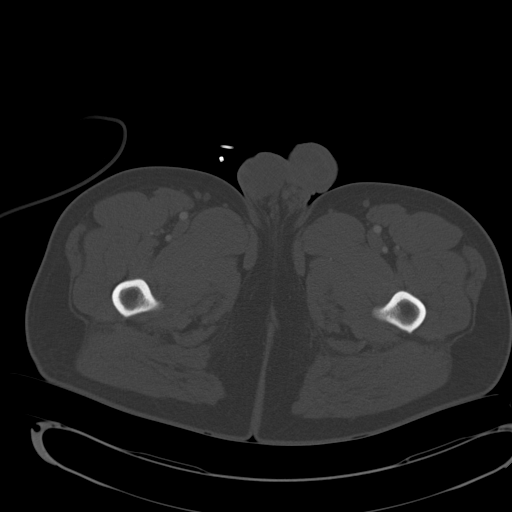
[im 11/99  soft-tissue]
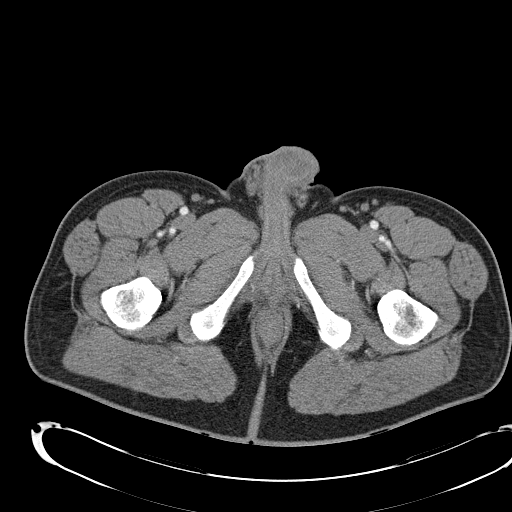
[im 22/99  soft-tissue]
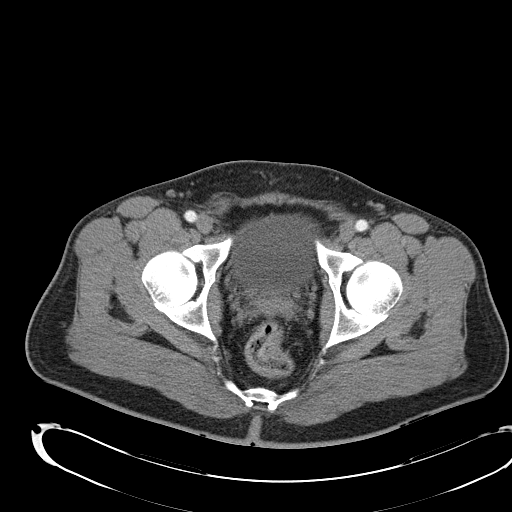
[im 28/99  soft-tissue]
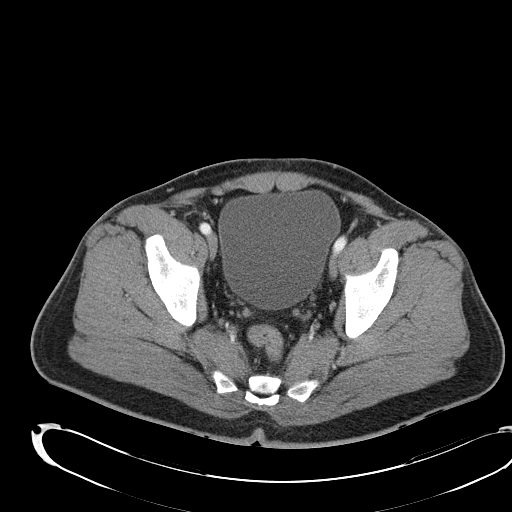
[im 33/99  soft-tissue]
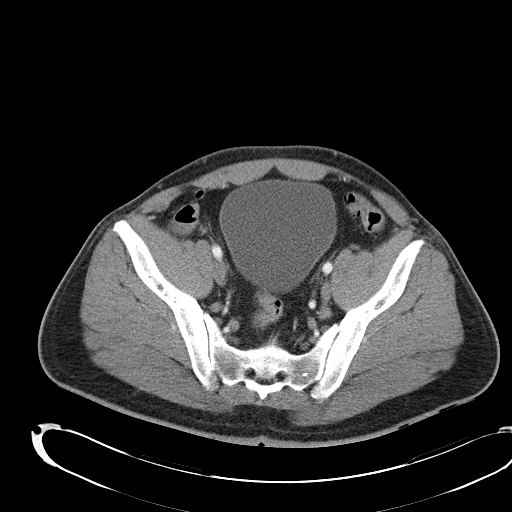
[im 39/99  soft-tissue]
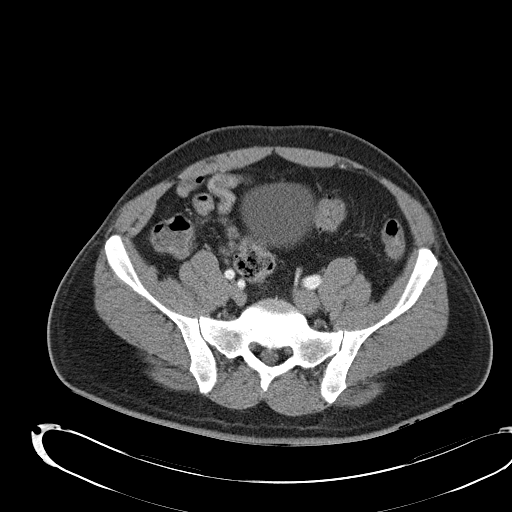
[im 44/99  soft-tissue]
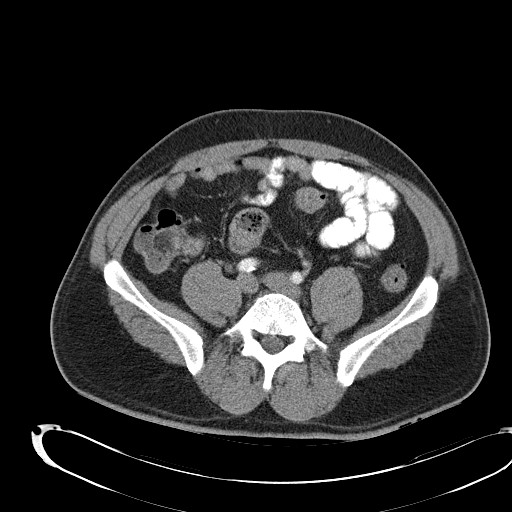
[im 55/99  soft-tissue]
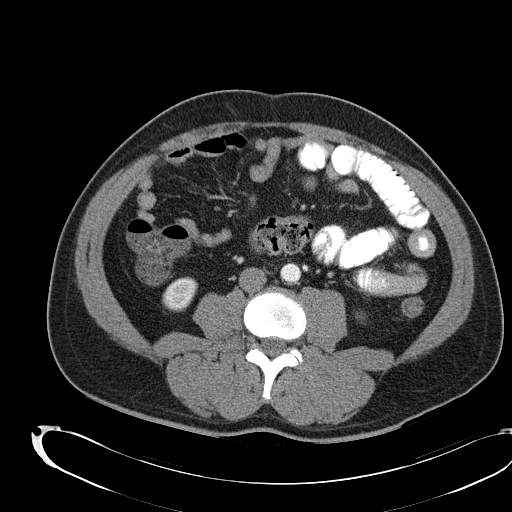
[im 60/99  soft-tissue]
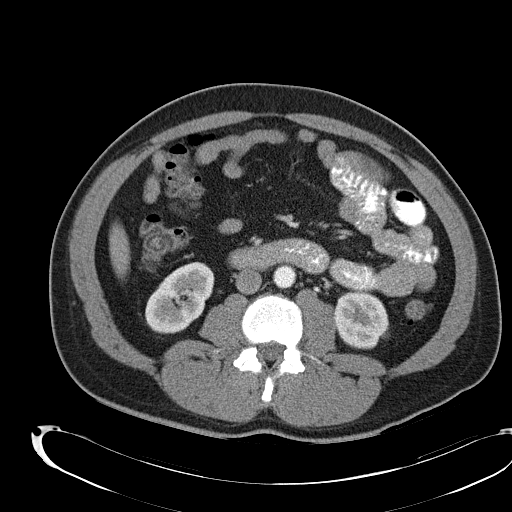
[im 60/99  bone]
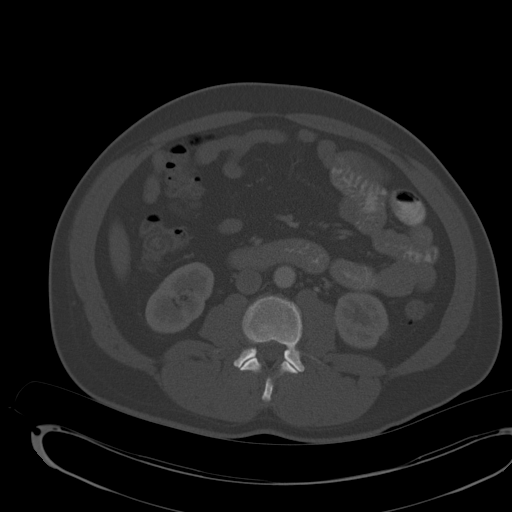
[im 66/99  soft-tissue]
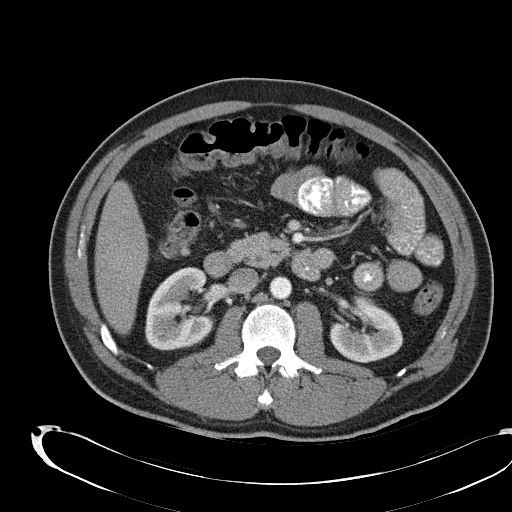
[im 71/99  soft-tissue]
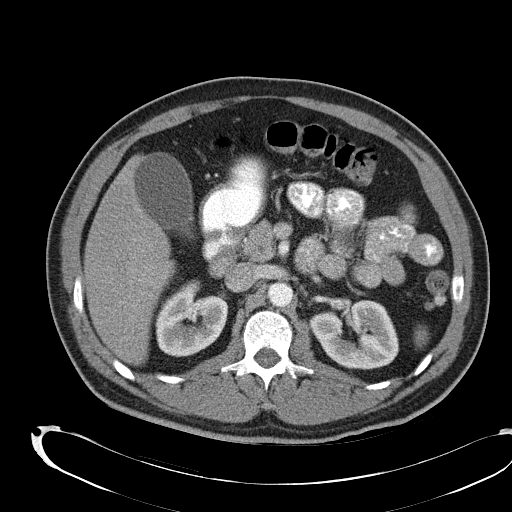
[im 77/99  soft-tissue]
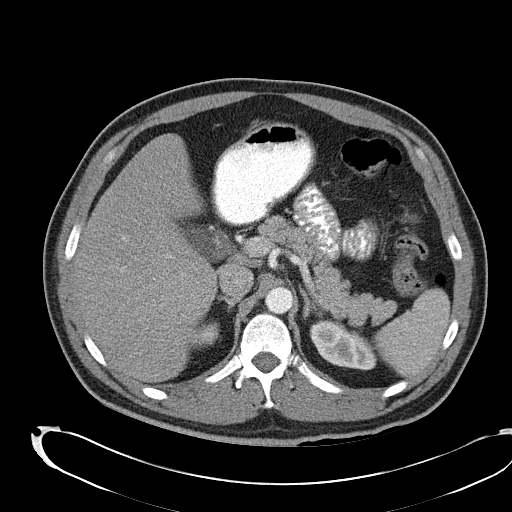
[im 88/99  soft-tissue]
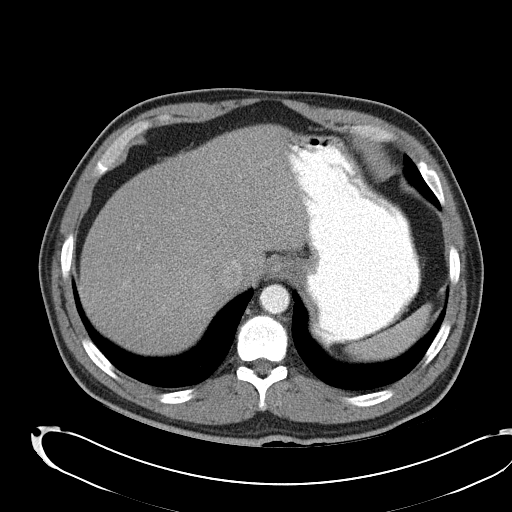
[im 93/99  soft-tissue]
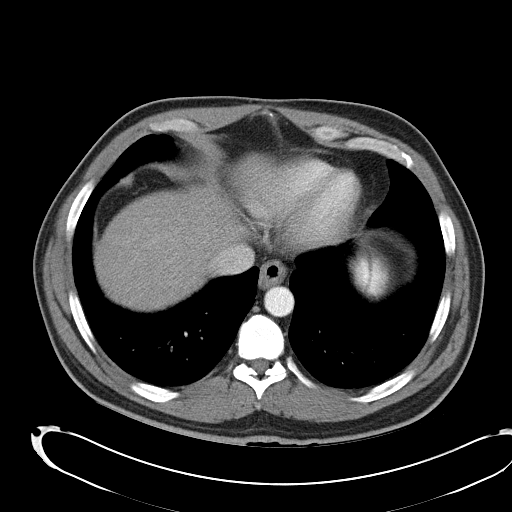

[Series 3: abd_pel_with 3.0 spo cor · coronal · 0.80mm/px · 3 of 94 slices shown]
[im 32/94  soft-tissue]
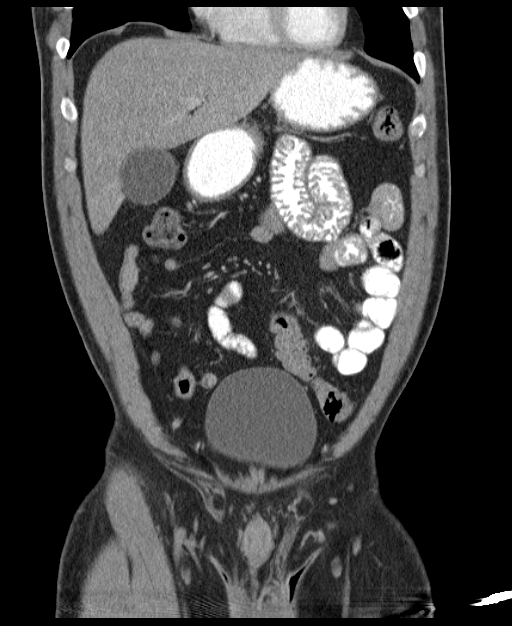
[im 42/94  soft-tissue]
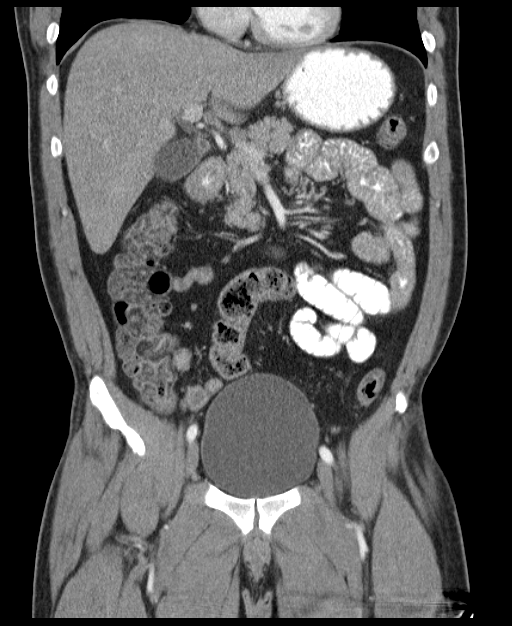
[im 52/94  soft-tissue]
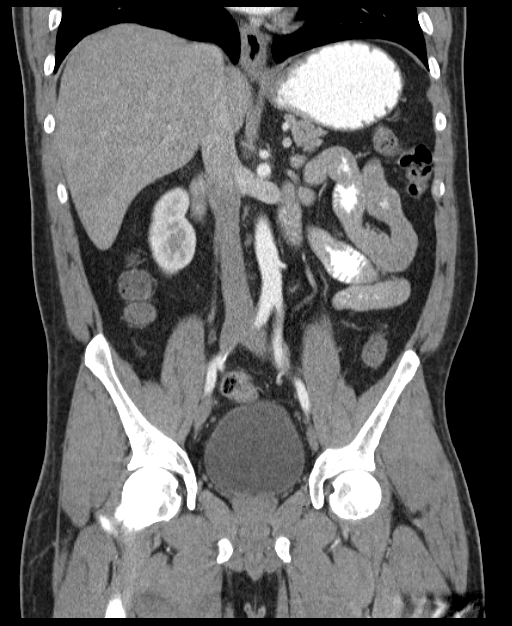

[17 of 46 positions shown; findings below may reference images not displayed]

FINDINGS: Mild diffuse hepatic steatosis. Gallbladder appears normal. Spleen
is normal. Pancreas is normal. Stomach is normal.

Adrenal glands are normal. Kidneys are normal.Large bowel is normal
except for mild diverticulosis. There are several loops of small
bowel in the left upper quadrant that show mild wall thickening.

Appendix is normal. Bladder is normal. Reproductive organs are
normal. No acute musculoskeletal findings.
IMPRESSION: Findings suggest mild proximal small bowel enteritis.

## 2015-12-13 IMAGING — US US ABDOMEN LIMITED
1 series · 14 of 25 positions shown · non-contrast
Comparison: Abdominal CT 06/28/2014

CLINICAL DATA: Pancreatitis.  Evaluate for gallstones.

EXAM:
US ABDOMEN LIMITED - RIGHT UPPER QUADRANT

[Series 1: us abdomen limited · 0.21mm/px · 14 of 54 slices shown]
[im 1/54]
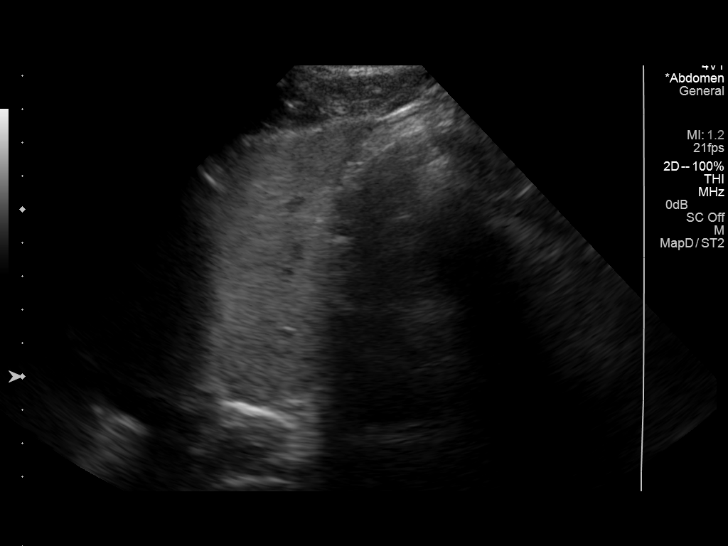
[im 5/54]
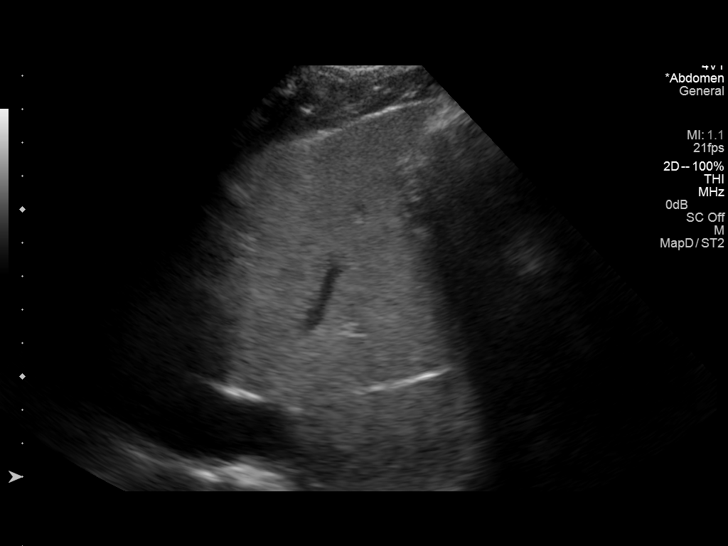
[im 9/54]
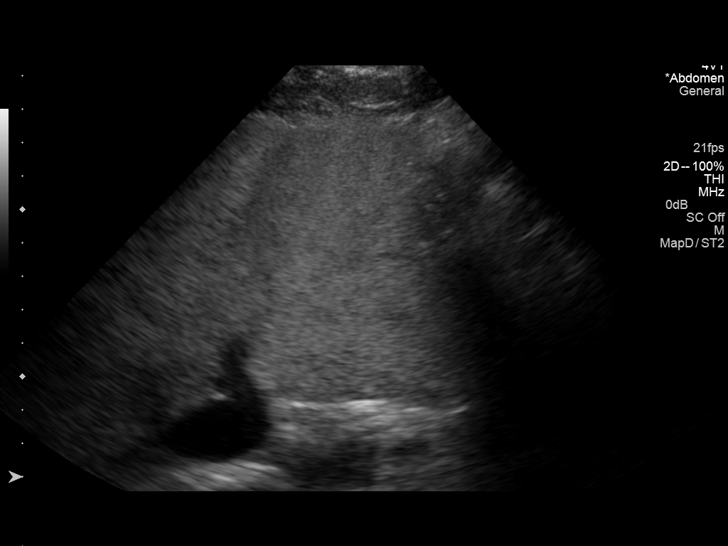
[im 14/54]
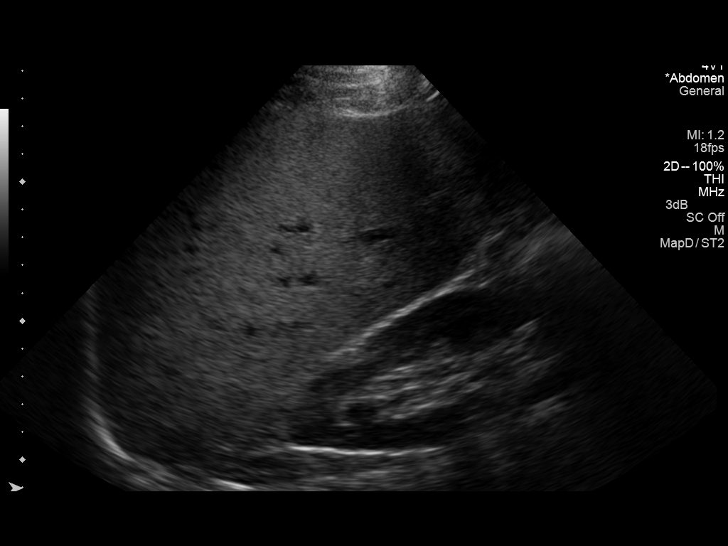
[im 18/54]
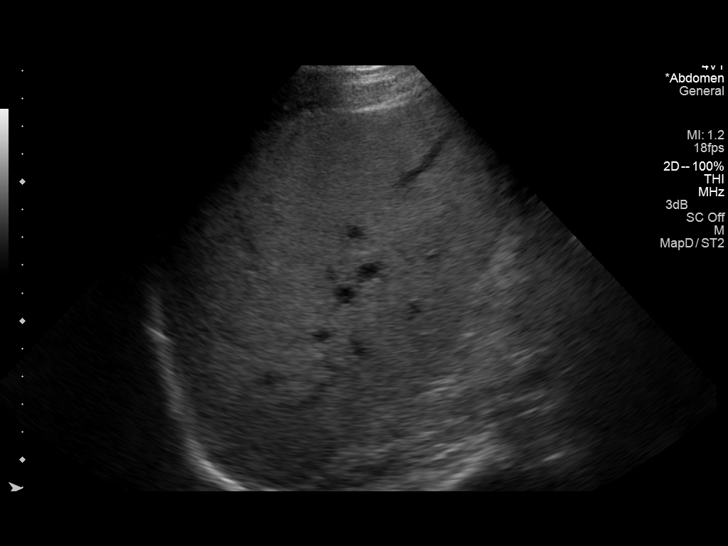
[im 20/54]
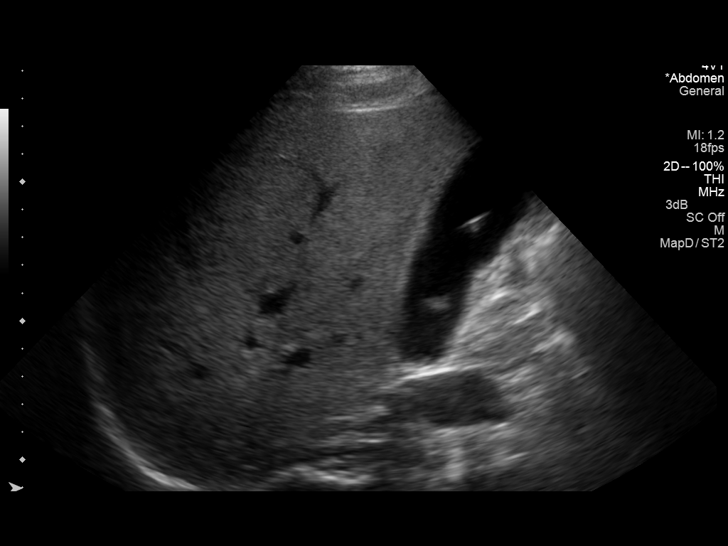
[im 25/54]
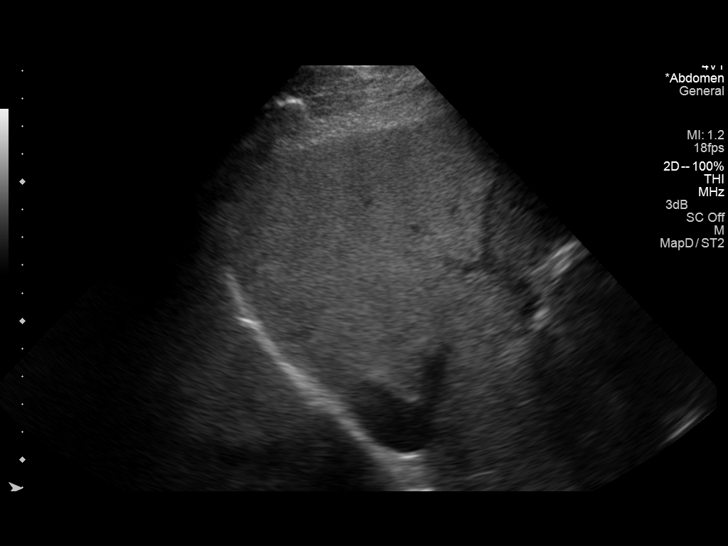
[im 29/54]
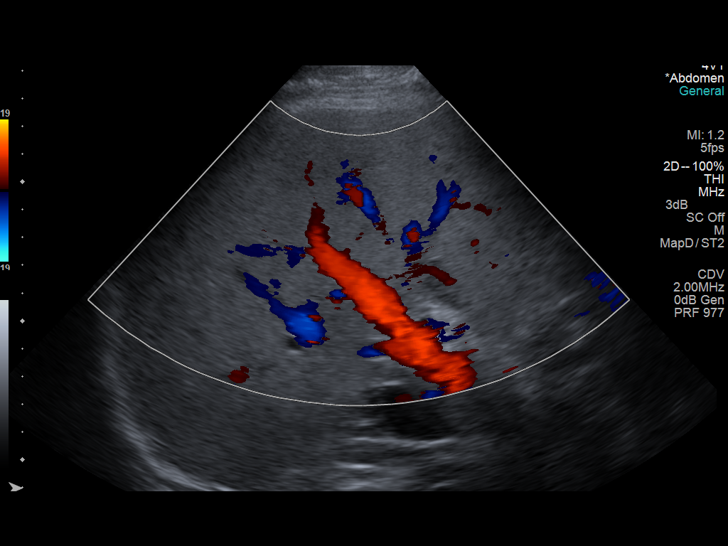
[im 34/54]
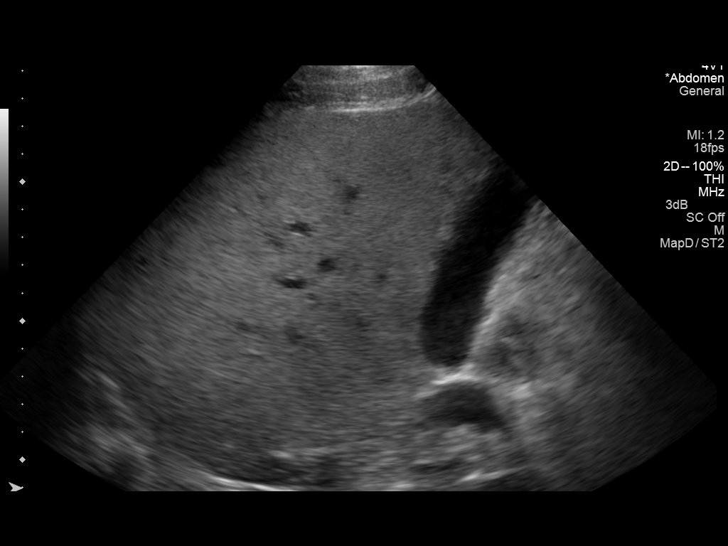
[im 36/54]
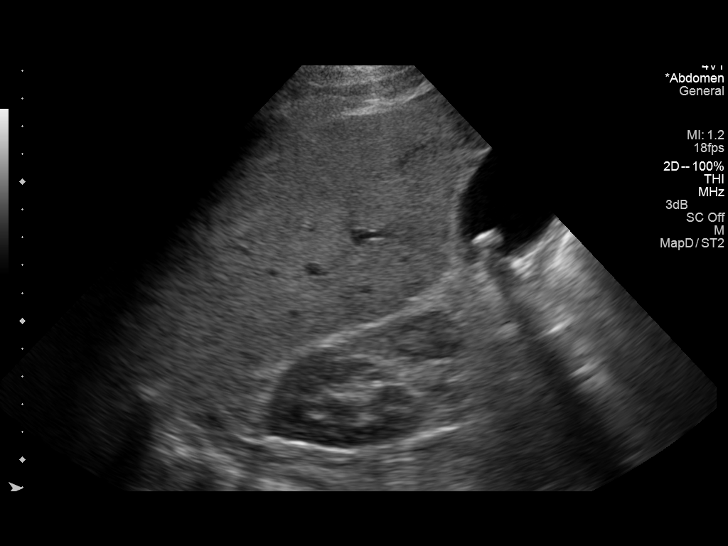
[im 40/54]
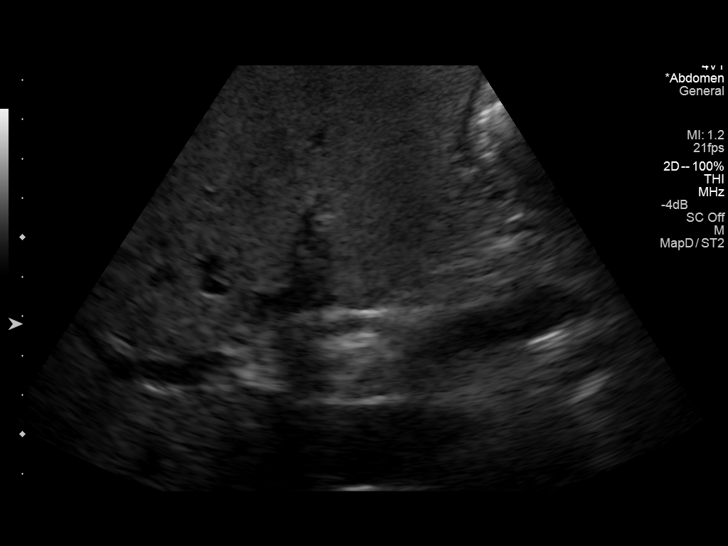
[im 45/54]
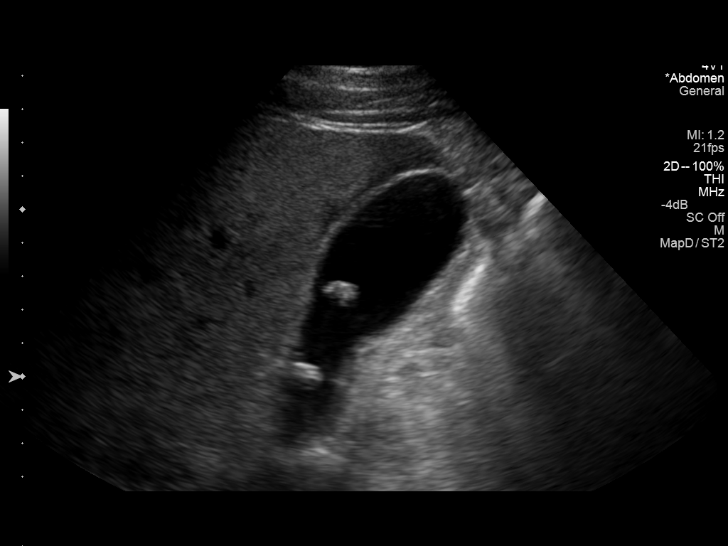
[im 49/54]
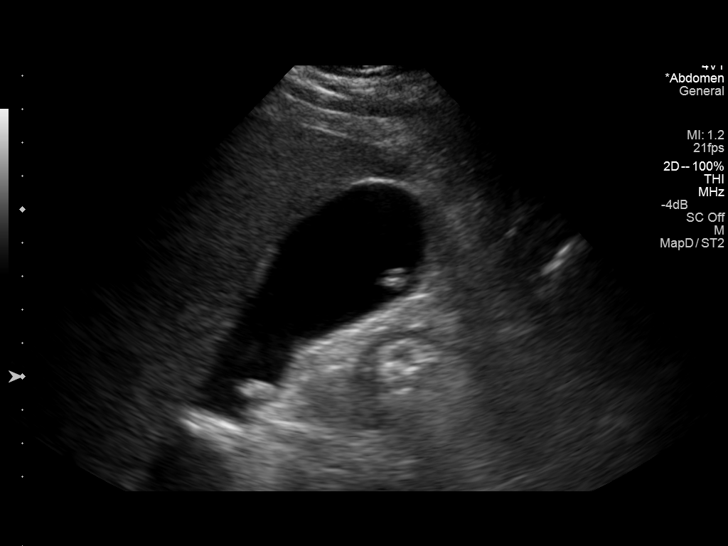
[im 54/54]
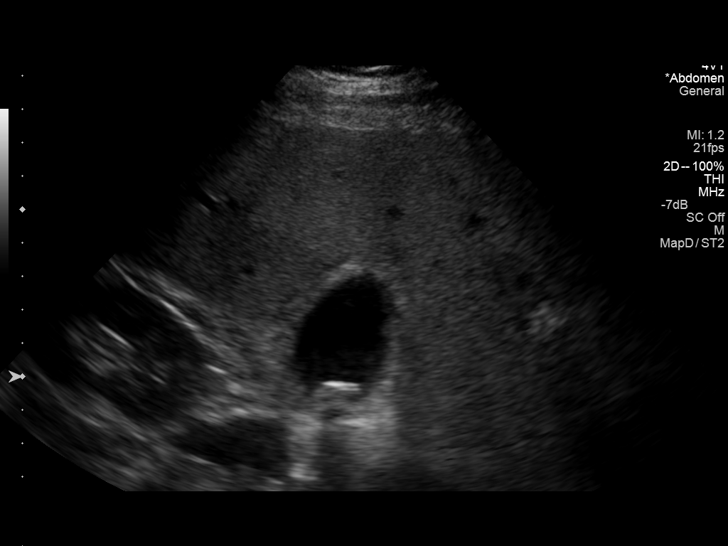

[14 of 25 positions shown; findings below may reference images not displayed]

FINDINGS: Gallbladder:

There are at least 2 echogenic stones within the gallbladder. The
stones demonstrate posterior acoustic shadowing. There is no
significant gallbladder wall thickening. The patient does not have a
sonographic Murphy's sign. Largest gallstone measures up to 1.4 cm.
Gallbladder is mildly distended and similar to the recent CT.

Common bile duct:

Diameter: 0.4 cm

Liver:

Liver parenchyma has slightly increased echogenicity. No focal liver
lesion. Liver measures 18.9 cm in length without intrahepatic
biliary dilatation.
IMPRESSION: Cholelithiasis. No evidence for acute cholecystitis. Negative for
biliary dilatation.

Liver parenchyma demonstrates increased echogenicity which could be
associated with some steatosis.

## 2016-03-11 ENCOUNTER — Emergency Department
Admission: EM | Admit: 2016-03-11 | Discharge: 2016-03-11 | Disposition: A | Discharge status: Home / Self Care | Payer: BLUE CROSS/BLUE SHIELD | Attending: Emergency Medicine | Admitting: Emergency Medicine

## 2016-03-11 ENCOUNTER — Encounter: Payer: Self-pay | Admitting: Emergency Medicine

## 2016-03-11 DIAGNOSIS — Y939 Activity, unspecified: Secondary | ICD-10-CM | POA: Insufficient documentation

## 2016-03-11 DIAGNOSIS — Y999 Unspecified external cause status: Secondary | ICD-10-CM | POA: Insufficient documentation

## 2016-03-11 DIAGNOSIS — S61452A Open bite of left hand, initial encounter: Secondary | ICD-10-CM

## 2016-03-11 DIAGNOSIS — S60312A Abrasion of left thumb, initial encounter: Secondary | ICD-10-CM | POA: Insufficient documentation

## 2016-03-11 DIAGNOSIS — W540XXA Bitten by dog, initial encounter: Secondary | ICD-10-CM | POA: Insufficient documentation

## 2016-03-11 DIAGNOSIS — Y929 Unspecified place or not applicable: Secondary | ICD-10-CM | POA: Insufficient documentation

## 2016-03-11 DIAGNOSIS — Z23 Encounter for immunization: Secondary | ICD-10-CM | POA: Insufficient documentation

## 2016-03-11 DIAGNOSIS — Z791 Long term (current) use of non-steroidal anti-inflammatories (NSAID): Secondary | ICD-10-CM | POA: Insufficient documentation

## 2016-03-11 DIAGNOSIS — F17210 Nicotine dependence, cigarettes, uncomplicated: Secondary | ICD-10-CM | POA: Insufficient documentation

## 2016-03-11 MED ORDER — HYDROCODONE-ACETAMINOPHEN 5-325 MG PO TABS
1.0000 | ORAL_TABLET | ORAL | 0 refills | Status: AC | PRN
Start: 2016-03-11 — End: ?

## 2016-03-11 MED ORDER — AMOXICILLIN-POT CLAVULANATE 875-125 MG PO TABS: 1.0000 | ORAL_TABLET | Freq: Two times a day (BID) | ORAL | 0 refills | Status: AC

## 2016-03-11 MED ORDER — TETANUS-DIPHTH-ACELL PERTUSSIS 5-2.5-18.5 LF-MCG/0.5 IM SUSP
0.5000 mL | Freq: Once | INTRAMUSCULAR | Status: AC
  Administered 2016-03-11: 0.5 mL via INTRAMUSCULAR
  Filled 2016-03-11: qty 0.5

## 2016-03-11 NOTE — ED Notes (Signed)
Patient reports that he was breaking up a fight last between his dog and another dog, his dog accidentally bit his hand. Patient states that the dogs tooth went all the way through at the base of this thumb. Patient states that dog is UTD on all vaccines. Unsure of last tetanus shot

## 2016-03-11 NOTE — ED Provider Notes (Signed)
[facility] Emergency Department Provider Note  ____________________________________________  Time seen: Approximately 4:42 PM  I have reviewed the triage vital signs and the nursing notes.   HISTORY  Chief Complaint Animal Bite    HPI Gabriel Bond is a 50 y.o. male who presents emergency department complaining of dog bite to his left hand. Patient states that his dog was fighting another dog last night when he tried to separate them. Dog bit his hand. His dog is up-to-date on all immunizations. No concern for rabies. Patient's unable to remember his last tetanus shot. Patient reports that while he does have full range of motion of hand and causes significant pain with any kind of motion. No other injury or complaint.   Past Medical History:  Diagnosis Date  . Dysrhythmia    Diagnosed irregular heart beat at 50 years old.  No problems since.  . Gallstones     Patient Active Problem List   Diagnosis Date Noted  . Pancreatitis 06/28/2014    Past Surgical History:  Procedure Laterality Date  . CHOLECYSTECTOMY N/A 01/20/2015   Procedure: LAPAROSCOPIC CHOLECYSTECTOMY;  Surgeon: Franky Macho, MD;  Location: [facility];  Service: General;  Laterality: N/A;  . WISDOM TOOTH EXTRACTION Bilateral 2013    Prior to Admission medications   Medication Sig Start Date End Date Taking? Authorizing Provider  acetaminophen (TYLENOL) 500 MG tablet Take 500-1,500 mg by mouth every 6 (six) hours as needed for mild pain.     Historical Provider, MD  amoxicillin-clavulanate (AUGMENTIN) 875-125 MG tablet Take 1 tablet by mouth 2 (two) times daily. 03/11/16   Delorise Royals Cuthriell, PA-C  HYDROcodone-acetaminophen (NORCO/VICODIN) 5-325 MG tablet Take 1 tablet by mouth every 4 (four) hours as needed for moderate pain. 03/11/16   Delorise Royals Cuthriell, PA-C  oxyCODONE-acetaminophen (PERCOCET) 7.5-325 MG per tablet Take 1-2 tablets by mouth every 4 (four) hours as needed. 01/20/15   Franky Macho, MD    Allergies Review of patient's allergies indicates no known allergies.  Family History  Problem Relation Age of Onset  . Cancer Other   . Diabetes Other     Social History Social History  Substance Use Topics  . Smoking status: Current Every Day Smoker    Packs/day: 1.50    Years: 30.00    Types: Cigarettes  . Smokeless tobacco: Former Neurosurgeon    Types: Chew  . Alcohol use No     Review of Systems  Constitutional: No fever/chills Cardiovascular: no chest pain. Respiratory: no cough. No SOB. Musculoskeletal: Negative for musculoskeletal pain. Skin: Positive for dog bite to the left hand/base of thumb. Neurological: Negative for headaches, focal weakness or numbness. 10-point ROS otherwise negative.  ____________________________________________   PHYSICAL EXAM:  VITAL SIGNS: ED Triage Vitals  Enc Vitals Group     BP 03/11/16 1526 107/66     Pulse Rate 03/11/16 1526 78     Resp 03/11/16 1526 18     Temp 03/11/16 1526 98.5 F (36.9 C)     Temp Source 03/11/16 1526 Oral     SpO2 03/11/16 1526 96 %     Weight 03/11/16 1525 185 lb (83.9 kg)     Height 03/11/16 1525 5\' 7"  (1.702 m)     Head Circumference --      Peak Flow --      Pain Score 03/11/16 1527 5     Pain Loc --      Pain Edu? --  Excl. in GC? --      Constitutional: Alert and oriented. Well appearing and in no acute distress. Eyes: Conjunctivae are normal. PERRL. EOMI. Head: Atraumatic. Cardiovascular: Normal rate, regular rhythm. Normal S1 and S2.  Good peripheral circulation. Respiratory: Normal respiratory effort without tachypnea or retractions. Lungs CTAB. Good air entry to the bases with no decreased or absent breath sounds. Musculoskeletal: Full range of motion to all extremities. No gross deformities appreciated.No deformity noted to left hand, but inspection. Upon coaxing, patient is ill removed, appropriately. No loss of range of motion. Sensation and cap refill intact.  Puncture wounds noted to the base of the finger on the anterior aspect. 2 small superficial abrasion/lacerations noted to dorsal thumb. No signs of infection. Neurologic:  Normal speech and language. No gross focal neurologic deficits are appreciated.  Skin:  Skin is warm, dry and intact. No rash noted. Psychiatric: Mood and affect are normal. Speech and behavior are normal. Patient exhibits appropriate insight and judgement.   ____________________________________________   LABS (all labs ordered are listed, but only abnormal results are displayed)  Labs Reviewed - No data to display ____________________________________________  EKG   ____________________________________________  RADIOLOGY   No results found.  ____________________________________________    PROCEDURES  Procedure(s) performed:    Procedures  The wound is cleansed place of the hand and Betadine solution, debrided of foreign material as much as possible, and dressed. The patient is alerted to watch for any signs of infection (redness, pus, pain, increased swelling or fever) and call if such occurs. Home wound care instructions are provided. Tetanus vaccination status reviewed: Td vaccination indicated and given today.   Medications  Tdap (BOOSTRIX) injection 0.5 mL (0.5 mLs Intramuscular Given 03/11/16 1700)     ____________________________________________   INITIAL IMPRESSION / ASSESSMENT AND PLAN / ED COURSE  Pertinent labs & imaging results that were available during my care of the patient were reviewed by me and considered in my medical decision making (see chart for details).  Review of the [place] CSRS was performed in accordance of the [facility] prior to dispensing any controlled drugs.  Clinical Course    Patient's diagnosis is consistent with Dog bite to left hand. Wound care is provided to patient. Patient was given tetanus shot here in the emergency department. Exam is reassuring with no  indication for osseous or ligamentous injury. Patient will be discharged home with prescriptions for antibiotics and limited pain medication. Patient is to follow up with primary care provider as needed or otherwise directed. Patient is given ED precautions to return to the ED for any worsening or new symptoms.     ____________________________________________  FINAL CLINICAL IMPRESSION(S) / ED DIAGNOSES  Final diagnoses:  Dog bite, hand, left, initial encounter      NEW MEDICATIONS STARTED DURING THIS VISIT:  New Prescriptions   AMOXICILLIN-CLAVULANATE (AUGMENTIN) 875-125 MG TABLET    Take 1 tablet by mouth 2 (two) times daily.   HYDROCODONE-ACETAMINOPHEN (NORCO/VICODIN) 5-325 MG TABLET    Take 1 tablet by mouth every 4 (four) hours as needed for moderate pain.        This chart was dictated using voice recognition software/Dragon. Despite best efforts to proofread, errors can occur which can change the meaning. Any change was purely unintentional.    Racheal PatchesJonathan D Cuthriell, PA-C 03/11/16 1720    Rockne MenghiniAnne-Caroline Norman, MD 03/11/16 2310

## 2016-03-11 NOTE — ED Triage Notes (Signed)
Patient presents to the ED with puncture wounds to the left hand from a dog bite. Patient states "i need a tetanus shot and a couple days of something for pain". Site is red and swollen

## 2016-12-11 ENCOUNTER — Emergency Department
Admission: EM | Admit: 2016-12-11 | Discharge: 2016-12-11 | Disposition: A | Discharge status: Home / Self Care | Payer: 59 | Attending: Emergency Medicine | Admitting: Emergency Medicine

## 2016-12-11 DIAGNOSIS — K047 Periapical abscess without sinus: Secondary | ICD-10-CM | POA: Diagnosis not present

## 2016-12-11 DIAGNOSIS — R6884 Jaw pain: Secondary | ICD-10-CM | POA: Diagnosis present

## 2016-12-11 MED ORDER — KETOROLAC TROMETHAMINE 30 MG/ML IJ SOLN
30.0000 mg | Freq: Once | INTRAMUSCULAR | Status: AC
  Administered 2016-12-11: 30 mg via INTRAMUSCULAR
  Filled 2016-12-11: qty 1

## 2016-12-11 MED ORDER — KETOROLAC TROMETHAMINE 10 MG PO TABS: 10.0000 mg | ORAL_TABLET | Freq: Four times a day (QID) | ORAL | 0 refills | Status: AC | PRN

## 2016-12-11 MED ORDER — CLINDAMYCIN HCL 300 MG PO CAPS: 300.0000 mg | ORAL_CAPSULE | Freq: Three times a day (TID) | ORAL | 0 refills | Status: AC

## 2016-12-11 NOTE — ED Notes (Signed)
See triage note states he is having increased pain and swelling to right side of face and gumline  States he has a large blister to inside of mouth

## 2016-12-11 NOTE — ED Triage Notes (Signed)
Pt reports right lower jaw pain related to dental abscess and periodontal disease - he reports that the pain is to severe to bare

## 2016-12-11 NOTE — Discharge Instructions (Signed)
OPTIONS FOR DENTAL FOLLOW UP CARE ° °[place] Department of Health and Human Services - Local Safety Net Dental Clinics °http://www.ncdhhs.gov/dph/oralhealth/services/safetynetclinics.htm °  °[facility] (336-562-3123) ° °[facility] [place] (919-933-9087) ° °[facility] (919-663-1744 ext [phone]) ° °[facility] ([phone]) ° °[facility] ([phone]) °This clinic caters to the indigent population and is on a lottery system. °Location: °[facility], [address], [place] °Clinic Hours: °Wednesdays from 6pm - 9pm, patients seen by a lottery system. °For dates, call or go to www.med.[facility].edu/[facility]/patients/Dental-[facility] °Services: °Cleanings, fillings and simple extractions. °Payment Options: °DENTAL WORK IS FREE OF CHARGE. Bring proof of income or support. °Best way to get seen: °Arrive at 5:15 pm - this is a lottery, NOT first come/first serve, so arriving earlier will not increase your chances of being seen. °  °  °[facility] °[phone] °Select option 1 for emergencies °  °Location: °[facility], [address], [place] °Clinic Hours: °No walk-ins accepted - call the day before to schedule an appointment. °Check in times are 9:30 am and 1:30 pm. °Services: °Simple extractions, temporary fillings, pulpectomy/pulp debridement, uncomplicated abscess drainage. °Payment Options: °PAYMENT IS DUE AT THE TIME OF SERVICE.  Fee is usually $100-200, additional surgical procedures (e.g. abscess drainage) may be extra. °Cash, checks, Visa/MasterCard accepted.  Can file Medicaid if patient is covered for dental - patient should call case worker to check. °No discount for [facility] patients. °Best way to get seen: °MUST call the day before and get onto the schedule. Can usually be seen the next 1-2 days. No walk-ins accepted. °  °  °[facility] °[phone] °   °Location: °[facility], [address], [place] °Clinic Hours: °M, W, Th, F 8am or 1:30pm, Tues 9a or 1:30 - first come/first served. °Services: °Simple extractions, temporary fillings, uncomplicated abscess drainage.  You do not need to be an [place] resident. °Payment Options: °PAYMENT IS DUE AT THE TIME OF SERVICE. °Dental insurance, otherwise sliding scale - bring proof of income or support. °Depending on income and treatment needed, cost is usually $50-200. °Best way to get seen: °Arrive early as it is first come/first served. °  °  °[facility] °[phone] °  °Location: °[address] °Clinic Hours: °Mon-Thu 8a-5p °Services: °Most basic dental services including extractions and fillings. °Payment Options: °PAYMENT IS DUE AT THE TIME OF SERVICE. °Sliding scale, up to 50% off - bring proof if income or support. °Medicaid with dental option accepted. °Best way to get seen: °Call to schedule an appointment, can usually be seen within 2 weeks OR they will try to see walk-ins - show up at 8a or 2p (you may have to wait). °  °  °[facility] °[phone] °[place] RESIDENTS ONLY °  °Location: °[facility], [address], [place], [place] [address] °Clinic Hours: By appointment only. °Monday - Thursday 8am-5pm, Friday 8am-12pm °Services: Cleanings, fillings, extractions. °Payment Options: °PAYMENT IS DUE AT THE TIME OF SERVICE. °Cash, Visa or MasterCard. Sliding scale - $30 minimum per service. °Best way to get seen: °Come in to office, complete packet and make an appointment - need proof of income °or support monies for each household member and proof of [place] residence. °Usually takes about a month to get in. °  °  °[facility] °[phone] °  °Location: °[address]   [place] °Clinic Hours: Walk-in Urgent Care Dental Services are offered Monday-Friday  mornings only. °The numbers of emergencies accepted daily is limited to the number of °providers available. °Maximum 15 - Mondays, Wednesdays & Thursdays °Maximum 10 - Tuesdays & Fridays °Services: °You do not need to be a [place] resident to be seen for a dental emergency. °Emergencies are defined as pain, swelling, abnormal bleeding, or dental trauma. Walkins will receive x-rays if needed. °NOTE: Dental cleaning is not an emergency. °Payment Options: °PAYMENT IS DUE AT THE TIME OF SERVICE. °Minimum co-pay is $40.00 for uninsured patients. °Minimum co-pay is $3.00 for Medicaid with dental coverage. °Dental Insurance is accepted and must be presented at time of visit. °Medicare does not cover dental. °Forms of payment: Cash, credit card, checks. °Best way to get seen: °If not previously registered with the clinic, walk-in dental registration begins at 7:15 am and is on a first come/first serve basis. °If previously registered with the clinic, call to make an appointment. °  °  °[facility] °[phone] °[place] RESIDENTS ONLY °  °Location: °[address], [place], [place] °Clinic Hours: °Mon-Thu 10a-2p °Services: Extractions only! °Payment Options: °FREE (donations accepted) - bring proof of income or support °Best way to get seen: °Call and schedule an appointment OR come at 8am on the 1st Monday of every month (except for holidays) when it is first come/first served. °  °  °[facility] °[phone] °  °Location: °[address], [place] °Clinic Hours: °Friday mornings °Services, Payment Options, Best way to get seen: °Call for info °

## 2016-12-11 NOTE — ED Provider Notes (Signed)
[facility] Emergency Department Provider Note  ____________________________________________  Time seen: Approximately 4:52 PM  I have reviewed the triage vital signs and the nursing notes.   HISTORY  Chief Complaint Facial Swelling    HPI Gabriel Bond is a 51 y.o. male presenting to the emergency department with 10/10 right lower jaw pain from dental abscess. Patient states that he sought care at a local dentist who wants to charge him 8,900 dollars for extractions and dentures.  Patient states that he would still have to pay $5,000.00 after teeth were removed. Patient denies fever, chills, chest pain, chest tightness, shortness of breath, nausea, vomiting or abdominal pain. Patient has been taking Ibuprofen.    Past Medical History:  Diagnosis Date  . Dysrhythmia    Diagnosed irregular heart beat at 51 years old.  No problems since.  . Gallstones     Patient Active Problem List   Diagnosis Date Noted  . Pancreatitis 06/28/2014    Past Surgical History:  Procedure Laterality Date  . CHOLECYSTECTOMY N/A 01/20/2015   Procedure: LAPAROSCOPIC CHOLECYSTECTOMY;  Surgeon: Franky MachoMark Jenkins, MD;  Location: [facility];  Service: General;  Laterality: N/A;  . WISDOM TOOTH EXTRACTION Bilateral 2013    Prior to Admission medications   Medication Sig Start Date End Date Taking? Authorizing Provider  acetaminophen (TYLENOL) 500 MG tablet Take 500-1,500 mg by mouth every 6 (six) hours as needed for mild pain.     [provider]  amoxicillin-clavulanate (AUGMENTIN) 875-125 MG tablet Take 1 tablet by mouth 2 (two) times daily. 03/11/16   Cuthriell, Delorise RoyalsJonathan D, PA-C  clindamycin (CLEOCIN) 300 MG capsule Take 1 capsule (300 mg total) by mouth 3 (three) times daily. 12/11/16 12/21/16  Orvil FeilWoods, Delonna Ney M, PA-C  HYDROcodone-acetaminophen (NORCO/VICODIN) 5-325 MG tablet Take 1 tablet by mouth every 4 (four) hours as needed for moderate pain. 03/11/16   Cuthriell, Delorise RoyalsJonathan D,  PA-C  ketorolac (TORADOL) 10 MG tablet Take 1 tablet (10 mg total) by mouth every 6 (six) hours as needed. 12/11/16 12/16/16  Orvil FeilWoods, Jaymason Ledesma M, PA-C  oxyCODONE-acetaminophen (PERCOCET) 7.5-325 MG per tablet Take 1-2 tablets by mouth every 4 (four) hours as needed. 01/20/15   Franky MachoJenkins, Mark, MD    Allergies Patient has no known allergies.  Family History  Problem Relation Age of Onset  . Cancer Other   . Diabetes Other     Social History Social History  Substance Use Topics  . Smoking status: Current Every Day Smoker    Packs/day: 1.50    Years: 30.00    Types: Cigarettes  . Smokeless tobacco: Former NeurosurgeonUser    Types: Chew  . Alcohol use No     Review of Systems  Constitutional: No fever/chills Eyes: No visual changes. No discharge ENT: No upper respiratory complaints. Cardiovascular: no chest pain. Respiratory: no cough. No SOB. Gastrointestinal: No abdominal pain.  No nausea, no vomiting.  No diarrhea.  No constipation. Musculoskeletal: Negative for musculoskeletal pain. Skin: Patient has edema of the right lower jaw.  Neurological: Negative for headaches, focal weakness or numbness.  ____________________________________________   PHYSICAL EXAM:  VITAL SIGNS: ED Triage Vitals  Enc Vitals Group     BP 12/11/16 1532 123/79     Pulse Rate 12/11/16 1532 (!) 59     Resp 12/11/16 1532 16     Temp 12/11/16 1532 98.2 F (36.8 C)     Temp Source 12/11/16 1532 Oral     SpO2 12/11/16 1532 97 %  Weight 12/11/16 1532 180 lb (81.6 kg)     Height 12/11/16 1532 5\' 7"  (1.702 m)     Head Circumference --      Peak Flow --      Pain Score 12/11/16 1531 10     Pain Loc --      Pain Edu? --      Excl. in GC? --     Constitutional: Alert and oriented. Well appearing and in no acute distress. Eyes: Conjunctivae are normal. PERRL. EOMI. Head: Atraumatic. Hematological/Lymphatic/Immunilogical: No cervical lymphadenopathy. Cardiovascular: Normal rate, regular rhythm. Normal S1 and  S2.  Good peripheral circulation. Respiratory: Normal respiratory effort without tachypnea or retractions. Lungs CTAB. Good air entry to the bases with no decreased or absent breath sounds. Musculoskeletal: Patient has right lower jaw pain.  Neurologic:  Normal speech and language. No gross focal neurologic deficits are appreciated.  Skin: Patient has right lower jaw abscess.  Psychiatric: Mood and affect are normal. Speech and behavior are normal. Patient exhibits appropriate insight and judgement.   ____________________________________________   LABS (all labs ordered are listed, but only abnormal results are displayed)  Labs Reviewed - No data to display ____________________________________________  EKG   ____________________________________________  RADIOLOGY   No results found.  ____________________________________________    PROCEDURES  Procedure(s) performed:    Procedures    Medications  ketorolac (TORADOL) 30 MG/ML injection 30 mg (not administered)     ____________________________________________   INITIAL IMPRESSION / ASSESSMENT AND PLAN / ED COURSE  Pertinent labs & imaging results that were available during my care of the patient were reviewed by me and considered in my medical decision making (see chart for details).  Review of the [place] CSRS was performed in accordance of the [facility] prior to dispensing any controlled drugs.    Assessment and Plan: Right Lower Jaw Dental Abscess:  Patient presents to the emergency department with a right lower jaw abscess. Physical exam and vital signs are reassuring. Patient was given an injection of Toradol in the emergency department and discharged with Toradol and Clindamycin. Patient was advised to seek care with a dentist as soon as possible. All patient questions were answered.     ____________________________________________  FINAL CLINICAL IMPRESSION(S) / ED DIAGNOSES  Final diagnoses:  Dental abscess       NEW MEDICATIONS STARTED DURING THIS VISIT:  New Prescriptions   CLINDAMYCIN (CLEOCIN) 300 MG CAPSULE    Take 1 capsule (300 mg total) by mouth 3 (three) times daily.   KETOROLAC (TORADOL) 10 MG TABLET    Take 1 tablet (10 mg total) by mouth every 6 (six) hours as needed.        This chart was dictated using voice recognition software/Dragon. Despite best efforts to proofread, errors can occur which can change the meaning. Any change was purely unintentional.    Orvil Feil, PA-C 12/11/16 1705    Jene Every, MD 12/11/16 [phone]

## 2017-11-05 ENCOUNTER — Emergency Department
Admission: EM | Admit: 2017-11-05 | Discharge: 2017-11-05 | Disposition: A | Discharge status: Home / Self Care | Payer: 59 | Attending: Emergency Medicine | Admitting: Emergency Medicine

## 2017-11-05 ENCOUNTER — Encounter: Payer: Self-pay | Admitting: Emergency Medicine

## 2017-11-05 DIAGNOSIS — Z9049 Acquired absence of other specified parts of digestive tract: Secondary | ICD-10-CM | POA: Diagnosis not present

## 2017-11-05 DIAGNOSIS — Z79899 Other long term (current) drug therapy: Secondary | ICD-10-CM | POA: Insufficient documentation

## 2017-11-05 DIAGNOSIS — B359 Dermatophytosis, unspecified: Secondary | ICD-10-CM | POA: Insufficient documentation

## 2017-11-05 DIAGNOSIS — L299 Pruritus, unspecified: Secondary | ICD-10-CM | POA: Diagnosis not present

## 2017-11-05 DIAGNOSIS — R21 Rash and other nonspecific skin eruption: Secondary | ICD-10-CM | POA: Insufficient documentation

## 2017-11-05 DIAGNOSIS — F17210 Nicotine dependence, cigarettes, uncomplicated: Secondary | ICD-10-CM | POA: Diagnosis not present

## 2017-11-05 MED ORDER — CLOTRIMAZOLE-BETAMETHASONE 1-0.05 % EX CREA: TOPICAL_CREAM | Freq: Two times a day (BID) | CUTANEOUS | Status: DC

## 2017-11-05 MED ORDER — CLOTRIMAZOLE-BETAMETHASONE 1-0.05 % EX CREA: 1.0000 "application " | TOPICAL_CREAM | Freq: Two times a day (BID) | CUTANEOUS | 0 refills | Status: AC

## 2017-11-05 NOTE — ED Notes (Signed)
Red rash on buttocks. Stinging, itching.

## 2017-11-05 NOTE — ED Triage Notes (Signed)
Pt to ed with c/o rash to groin area. Pt states he was seen at [facility] and given ointment which actually made the rash worse.

## 2017-11-05 NOTE — ED Provider Notes (Signed)
[facility] Emergency Department Provider Note  ____________________________________________  Time seen: Approximately 11:29 AM  I have reviewed the triage vital signs and the nursing notes.   HISTORY  Chief Complaint Rash    HPI Gabriel Bond is a 52 y.o. male that presents to the emergency department for evaluation of rash to buttocks for several months.  Patient states that rash started out small several months ago and has been increasingly getting worse.  Rash burns and itches.  Rash is scaling. He was given a steroid cream by PCP and rash improved while using the cream but when he discontinued the cream rash got worse. He started wearing boxers and briefs 6 months ago. No fever, chills.    Past Medical History:  Diagnosis Date  . Dysrhythmia    Diagnosed irregular heart beat at 52 years old.  No problems since.  . Gallstones     Patient Active Problem List   Diagnosis Date Noted  . Pancreatitis 06/28/2014    Past Surgical History:  Procedure Laterality Date  . CHOLECYSTECTOMY N/A 01/20/2015   Procedure: LAPAROSCOPIC CHOLECYSTECTOMY;  Surgeon: Franky MachoMark Jenkins, MD;  Location: [facility];  Service: General;  Laterality: N/A;  . WISDOM TOOTH EXTRACTION Bilateral 2013    Prior to Admission medications   Medication Sig Start Date End Date Taking? Authorizing Provider  acetaminophen (TYLENOL) 500 MG tablet Take 500-1,500 mg by mouth every 6 (six) hours as needed for mild pain.     [provider]  amoxicillin-clavulanate (AUGMENTIN) 875-125 MG tablet Take 1 tablet by mouth 2 (two) times daily. 03/11/16   Cuthriell, Delorise RoyalsJonathan D, PA-C  clotrimazole-betamethasone (LOTRISONE) cream Apply 1 application topically 2 (two) times daily. 11/05/17   Enid DerryWagner, Jens Siems, PA-C  HYDROcodone-acetaminophen (NORCO/VICODIN) 5-325 MG tablet Take 1 tablet by mouth every 4 (four) hours as needed for moderate pain. 03/11/16   Cuthriell, Delorise RoyalsJonathan D, PA-C  oxyCODONE-acetaminophen  (PERCOCET) 7.5-325 MG per tablet Take 1-2 tablets by mouth every 4 (four) hours as needed. 01/20/15   Franky MachoJenkins, Mark, MD    Allergies Patient has no known allergies.  Family History  Problem Relation Age of Onset  . Cancer Other   . Diabetes Other     Social History Social History   Tobacco Use  . Smoking status: Current Every Day Smoker    Packs/day: 1.50    Years: 30.00    Pack years: 45.00    Types: Cigarettes  . Smokeless tobacco: Former NeurosurgeonUser    Types: Chew  Substance Use Topics  . Alcohol use: No  . Drug use: No     Review of Systems  Constitutional: No fever/chills Gastrointestinal: No abdominal pain.  No nausea, no vomiting.  Genitourinary: Negative for dysuria. Musculoskeletal: Negative for musculoskeletal pain. Skin: Negative for abrasions, lacerations, ecchymosis. Positive for rash. Neurological: Negative for numbness or tingling   ____________________________________________   PHYSICAL EXAM:  VITAL SIGNS: ED Triage Vitals [11/05/17 1052]  Enc Vitals Group     BP 134/78     Pulse Rate 77     Resp 16     Temp 98.4 F (36.9 C)     Temp Source Oral     SpO2 97 %     Weight 197 lb (89.4 kg)     Height 5\' 6"  (1.676 m)     Head Circumference      Peak Flow      Pain Score 6     Pain Loc      Pain  Edu?      Excl. in GC?      Constitutional: Alert and oriented. Well appearing and in no acute distress. Eyes: Conjunctivae are normal. PERRL. EOMI. Head: Atraumatic. ENT:      Ears:      Nose: No congestion/rhinnorhea.      Mouth/Throat: Mucous membranes are moist.  Neck: No stridor.   Cardiovascular: Good peripheral circulation. Respiratory: Normal respiratory effort without tachypnea or retractions.  Musculoskeletal: Full range of motion to all extremities. No gross deformities appreciated. Neurologic:  Normal speech and language. No gross focal neurologic deficits are appreciated.  Skin:  Skin is warm, dry and intact. Bright red macules to  bilateral buttocks. No drainage. No surrounding erythema.  Psychiatric: Mood and affect are normal. Speech and behavior are normal. Patient exhibits appropriate insight and judgement.   ____________________________________________   LABS (all labs ordered are listed, but only abnormal results are displayed)  Labs Reviewed - No data to display ____________________________________________  EKG   ____________________________________________  RADIOLOGY  No results found.  ____________________________________________    PROCEDURES  Procedure(s) performed:    Procedures    Medications - No data to display   ____________________________________________   INITIAL IMPRESSION / ASSESSMENT AND PLAN / ED COURSE  Pertinent labs & imaging results that were available during my care of the patient were reviewed by me and considered in my medical decision making (see chart for details).  Review of the [place] CSRS was performed in accordance of the [facility] prior to dispensing any controlled drugs.   Patient's diagnosis is consistent with tinea infection. Vital signs and exam are reassuring. Patient will be discharged home with prescriptions for lotrisone. Patient is to follow up with dermatology as directed. Patient is given ED precautions to return to the ED for any worsening or new symptoms.     ____________________________________________  FINAL CLINICAL IMPRESSION(S) / ED DIAGNOSES  Final diagnoses:  Rash and nonspecific skin eruption  Tinea      NEW MEDICATIONS STARTED DURING THIS VISIT:  ED Discharge Orders        Ordered    clotrimazole-betamethasone (LOTRISONE) cream  2 times daily     11/05/17 1143          This chart was dictated using voice recognition software/Dragon. Despite best efforts to proofread, errors can occur which can change the meaning. Any change was purely unintentional.    Enid Derry, PA-C 11/05/17 1442    Arnaldo Natal,  MD 11/05/17 [phone]
